# Patient Record
Sex: Male | Born: 1960 | Race: Black or African American | Hispanic: No | Marital: Single | Smoking: Former smoker
Health system: Southern US, Community
[De-identification: ages and names within clinical notes are randomized; demographics above are authoritative.]

## PROBLEM LIST (undated history)

## (undated) DIAGNOSIS — G8929 Other chronic pain: Secondary | ICD-10-CM

## (undated) DIAGNOSIS — M549 Dorsalgia, unspecified: Secondary | ICD-10-CM

## (undated) HISTORY — PX: LEG SURGERY: SHX1003

## (undated) HISTORY — PX: APPENDECTOMY: SHX54

## (undated) HISTORY — PX: BACK SURGERY: SHX140

---

## 2016-07-16 ENCOUNTER — Encounter (HOSPITAL_COMMUNITY): Payer: Self-pay

## 2016-07-16 ENCOUNTER — Emergency Department (HOSPITAL_COMMUNITY): Payer: Self-pay

## 2016-07-16 ENCOUNTER — Emergency Department (HOSPITAL_COMMUNITY)
Admission: EM | Admit: 2016-07-16 | Discharge: 2016-07-17 | Disposition: A | Discharge status: Home / Self Care | Payer: Self-pay | Attending: Emergency Medicine | Admitting: Emergency Medicine

## 2016-07-16 DIAGNOSIS — Y939 Activity, unspecified: Secondary | ICD-10-CM | POA: Insufficient documentation

## 2016-07-16 DIAGNOSIS — Y999 Unspecified external cause status: Secondary | ICD-10-CM | POA: Insufficient documentation

## 2016-07-16 DIAGNOSIS — T148XXA Other injury of unspecified body region, initial encounter: Secondary | ICD-10-CM

## 2016-07-16 DIAGNOSIS — S40022A Contusion of left upper arm, initial encounter: Secondary | ICD-10-CM | POA: Insufficient documentation

## 2016-07-16 DIAGNOSIS — Z87891 Personal history of nicotine dependence: Secondary | ICD-10-CM | POA: Insufficient documentation

## 2016-07-16 DIAGNOSIS — Y92410 Unspecified street and highway as the place of occurrence of the external cause: Secondary | ICD-10-CM | POA: Insufficient documentation

## 2016-07-16 HISTORY — DX: Dorsalgia, unspecified: M54.9

## 2016-07-16 HISTORY — DX: Other chronic pain: G89.29
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
## 2016-07-17 ENCOUNTER — Emergency Department (HOSPITAL_COMMUNITY): Payer: Self-pay

## 2016-07-17 MED ORDER — DICLOFENAC SODIUM 50 MG PO TBEC: 50.0000 mg | DELAYED_RELEASE_TABLET | Freq: Two times a day (BID) | ORAL | 0 refills | Status: DC

## 2016-07-17 MED ORDER — CYCLOBENZAPRINE HCL 10 MG PO TABS: 10.0000 mg | ORAL_TABLET | Freq: Two times a day (BID) | ORAL | 0 refills | Status: DC | PRN

## 2016-07-17 NOTE — Discharge Instructions (Signed)
Do not drive while taking the muscle relaxant as it will make you sleepy. Return for any problems.  °
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
## 2017-05-21 ENCOUNTER — Observation Stay (HOSPITAL_COMMUNITY)
Admission: EM | Admit: 2017-05-21 | Discharge: 2017-05-22 | Disposition: A | Discharge status: Home / Self Care | Payer: Self-pay | Attending: Orthopedic Surgery | Admitting: Orthopedic Surgery

## 2017-05-21 ENCOUNTER — Emergency Department (HOSPITAL_COMMUNITY): Payer: Self-pay

## 2017-05-21 ENCOUNTER — Encounter (HOSPITAL_COMMUNITY): Payer: Self-pay

## 2017-05-21 ENCOUNTER — Encounter (HOSPITAL_COMMUNITY)
Admission: EM | Disposition: A | Discharge status: Home / Self Care | Payer: Self-pay | Source: Home / Self Care | Attending: Emergency Medicine

## 2017-05-21 ENCOUNTER — Emergency Department (HOSPITAL_COMMUNITY): Payer: Self-pay | Admitting: Certified Registered Nurse Anesthetist

## 2017-05-21 DIAGNOSIS — Z87891 Personal history of nicotine dependence: Secondary | ICD-10-CM | POA: Insufficient documentation

## 2017-05-21 DIAGNOSIS — S8291XA Unspecified fracture of right lower leg, initial encounter for closed fracture: Secondary | ICD-10-CM

## 2017-05-21 DIAGNOSIS — Z419 Encounter for procedure for purposes other than remedying health state, unspecified: Secondary | ICD-10-CM

## 2017-05-21 DIAGNOSIS — W000XXA Fall on same level due to ice and snow, initial encounter: Secondary | ICD-10-CM | POA: Insufficient documentation

## 2017-05-21 DIAGNOSIS — S82291A Other fracture of shaft of right tibia, initial encounter for closed fracture: Principal | ICD-10-CM | POA: Insufficient documentation

## 2017-05-21 DIAGNOSIS — S82201A Unspecified fracture of shaft of right tibia, initial encounter for closed fracture: Secondary | ICD-10-CM | POA: Diagnosis present

## 2017-05-21 HISTORY — PX: TIBIA IM NAIL INSERTION: SHX2516

## 2017-05-21 LAB — CBC WITH DIFFERENTIAL/PLATELET
BASOS ABS: 0 10*3/uL (ref 0.0–0.1)
BASOS PCT: 0 %
EOS ABS: 0.1 10*3/uL (ref 0.0–0.7)
EOS PCT: 1 %
HCT: 44.8 % (ref 39.0–52.0)
Hemoglobin: 15.1 g/dL (ref 13.0–17.0)
Lymphocytes Relative: 31 %
Lymphs Abs: 3.3 10*3/uL (ref 0.7–4.0)
MCH: 28.8 pg (ref 26.0–34.0)
MCHC: 33.7 g/dL (ref 30.0–36.0)
MCV: 85.3 fL (ref 78.0–100.0)
MONO ABS: 0.7 10*3/uL (ref 0.1–1.0)
Monocytes Relative: 6 %
Neutro Abs: 6.6 10*3/uL (ref 1.7–7.7)
Neutrophils Relative %: 62 %
Platelets: 300 10*3/uL (ref 150–400)
RBC: 5.25 MIL/uL (ref 4.22–5.81)
RDW: 12.8 % (ref 11.5–15.5)
WBC: 10.7 10*3/uL — ABNORMAL HIGH (ref 4.0–10.5)

## 2017-05-21 LAB — I-STAT CHEM 8, ED
BUN: 10 mg/dL (ref 6–20)
CALCIUM ION: 1.28 mmol/L (ref 1.15–1.40)
CHLORIDE: 104 mmol/L (ref 101–111)
Creatinine, Ser: 1.1 mg/dL (ref 0.61–1.24)
GLUCOSE: 117 mg/dL — AB (ref 65–99)
HCT: 47 % (ref 39.0–52.0)
Hemoglobin: 16 g/dL (ref 13.0–17.0)
Potassium: 4.4 mmol/L (ref 3.5–5.1)
SODIUM: 139 mmol/L (ref 135–145)
TCO2: 25 mmol/L (ref 22–32)

## 2017-05-21 SURGERY — INSERTION, INTRAMEDULLARY ROD, TIBIA
Anesthesia: General | Site: Ankle | Laterality: Right

## 2017-05-21 MED ORDER — FENTANYL CITRATE (PF) 250 MCG/5ML IJ SOLN
INTRAMUSCULAR | Status: DC | PRN
  Administered 2017-05-21: 150 ug via INTRAVENOUS
  Administered 2017-05-21: 100 ug via INTRAVENOUS

## 2017-05-21 MED ORDER — LACTATED RINGERS IV SOLN
INTRAVENOUS | Status: DC
  Administered 2017-05-21 – 2017-05-22 (×2): via INTRAVENOUS

## 2017-05-21 MED ORDER — MIDAZOLAM HCL 2 MG/2ML IJ SOLN
INTRAMUSCULAR | Status: DC | PRN
  Administered 2017-05-21: 2 mg via INTRAVENOUS

## 2017-05-21 MED ORDER — ROCURONIUM BROMIDE 10 MG/ML (PF) SYRINGE
PREFILLED_SYRINGE | INTRAVENOUS | Status: AC
  Filled 2017-05-21: qty 10

## 2017-05-21 MED ORDER — KETOROLAC TROMETHAMINE 15 MG/ML IJ SOLN
15.0000 mg | Freq: Four times a day (QID) | INTRAMUSCULAR | Status: AC
  Administered 2017-05-21 – 2017-05-22 (×4): 15 mg via INTRAVENOUS
  Filled 2017-05-21 (×3): qty 1

## 2017-05-21 MED ORDER — OXYCODONE HCL 5 MG PO TABS: 5.0000 mg | ORAL_TABLET | ORAL | Status: DC | PRN

## 2017-05-21 MED ORDER — DEXAMETHASONE SODIUM PHOSPHATE 10 MG/ML IJ SOLN
INTRAMUSCULAR | Status: DC | PRN
  Administered 2017-05-21: 10 mg via INTRAVENOUS

## 2017-05-21 MED ORDER — PROMETHAZINE HCL 25 MG/ML IJ SOLN
INTRAMUSCULAR | Status: AC
  Administered 2017-05-21: 6.25 mg via INTRAVENOUS
  Filled 2017-05-21: qty 1

## 2017-05-21 MED ORDER — SORBITOL 70 % SOLN: 30.0000 mL | Freq: Every day | Status: DC | PRN

## 2017-05-21 MED ORDER — ACETAMINOPHEN 650 MG RE SUPP: 650.0000 mg | RECTAL | Status: DC | PRN

## 2017-05-21 MED ORDER — DOCUSATE SODIUM 100 MG PO CAPS: 100.0000 mg | ORAL_CAPSULE | Freq: Two times a day (BID) | ORAL | 0 refills | Status: AC

## 2017-05-21 MED ORDER — MEPERIDINE HCL 25 MG/ML IJ SOLN: 6.2500 mg | INTRAMUSCULAR | Status: DC | PRN

## 2017-05-21 MED ORDER — ENOXAPARIN SODIUM 40 MG/0.4ML ~~LOC~~ SOLN
40.0000 mg | SUBCUTANEOUS | Status: DC
  Filled 2017-05-21: qty 0.4

## 2017-05-21 MED ORDER — METHOCARBAMOL 500 MG PO TABS: 500.0000 mg | ORAL_TABLET | Freq: Four times a day (QID) | ORAL | 0 refills | Status: AC | PRN

## 2017-05-21 MED ORDER — CEFAZOLIN SODIUM-DEXTROSE 2-4 GM/100ML-% IV SOLN
2.0000 g | INTRAVENOUS | Status: AC
  Administered 2017-05-21: 3 g via INTRAVENOUS
  Filled 2017-05-21: qty 100

## 2017-05-21 MED ORDER — HYDROMORPHONE HCL 1 MG/ML IJ SOLN
INTRAMUSCULAR | Status: AC
  Administered 2017-05-21: 0.5 mg via INTRAVENOUS
  Filled 2017-05-21: qty 1

## 2017-05-21 MED ORDER — HYDROMORPHONE HCL 1 MG/ML IJ SOLN
0.5000 mg | Freq: Once | INTRAMUSCULAR | Status: AC
Start: 2017-05-21 — End: 2017-05-21
  Administered 2017-05-21: 0.5 mg via INTRAVENOUS
  Filled 2017-05-21: qty 1

## 2017-05-21 MED ORDER — ACETAMINOPHEN 500 MG PO TABS
1000.0000 mg | ORAL_TABLET | Freq: Three times a day (TID) | ORAL | Status: AC
  Administered 2017-05-21 – 2017-05-22 (×3): 1000 mg via ORAL
  Filled 2017-05-21 (×3): qty 2

## 2017-05-21 MED ORDER — FENTANYL CITRATE (PF) 250 MCG/5ML IJ SOLN
INTRAMUSCULAR | Status: AC
  Filled 2017-05-21: qty 5

## 2017-05-21 MED ORDER — PHENYLEPHRINE 40 MCG/ML (10ML) SYRINGE FOR IV PUSH (FOR BLOOD PRESSURE SUPPORT)
PREFILLED_SYRINGE | INTRAVENOUS | Status: AC
  Filled 2017-05-21: qty 30

## 2017-05-21 MED ORDER — CEFAZOLIN SODIUM-DEXTROSE 2-4 GM/100ML-% IV SOLN
2.0000 g | Freq: Four times a day (QID) | INTRAVENOUS | Status: AC
  Administered 2017-05-21 – 2017-05-22 (×3): 2 g via INTRAVENOUS
  Filled 2017-05-21 (×4): qty 100

## 2017-05-21 MED ORDER — OXYCODONE HCL 5 MG PO TABS
10.0000 mg | ORAL_TABLET | ORAL | Status: DC | PRN
Start: 2017-05-21 — End: 2017-05-23
  Administered 2017-05-21 – 2017-05-22 (×5): 10 mg via ORAL
  Filled 2017-05-21 (×4): qty 2

## 2017-05-21 MED ORDER — ROCURONIUM BROMIDE 10 MG/ML (PF) SYRINGE
PREFILLED_SYRINGE | INTRAVENOUS | Status: DC | PRN
  Administered 2017-05-21 (×2): 20 mg via INTRAVENOUS
  Administered 2017-05-21: 60 mg via INTRAVENOUS

## 2017-05-21 MED ORDER — ONDANSETRON HCL 4 MG PO TABS: 4.0000 mg | ORAL_TABLET | Freq: Four times a day (QID) | ORAL | Status: DC | PRN

## 2017-05-21 MED ORDER — SUGAMMADEX SODIUM 200 MG/2ML IV SOLN
INTRAVENOUS | Status: DC | PRN
  Administered 2017-05-21: 250 mg via INTRAVENOUS

## 2017-05-21 MED ORDER — SENNA 8.6 MG PO TABS
1.0000 | ORAL_TABLET | Freq: Two times a day (BID) | ORAL | Status: DC
  Administered 2017-05-21 – 2017-05-22 (×2): 8.6 mg via ORAL
  Filled 2017-05-21 (×2): qty 1

## 2017-05-21 MED ORDER — POVIDONE-IODINE 10 % EX SWAB: 2.0000 "application " | Freq: Once | CUTANEOUS | Status: DC

## 2017-05-21 MED ORDER — LIDOCAINE 2% (20 MG/ML) 5 ML SYRINGE
INTRAMUSCULAR | Status: DC | PRN
  Administered 2017-05-21: 100 mg via INTRAVENOUS

## 2017-05-21 MED ORDER — ONDANSETRON HCL 4 MG/2ML IJ SOLN
4.0000 mg | Freq: Four times a day (QID) | INTRAMUSCULAR | Status: DC | PRN
  Administered 2017-05-22: 4 mg via INTRAVENOUS
  Filled 2017-05-21: qty 2

## 2017-05-21 MED ORDER — HYDROMORPHONE HCL 1 MG/ML IJ SOLN
0.2500 mg | INTRAMUSCULAR | Status: DC | PRN
  Administered 2017-05-21 (×4): 0.5 mg via INTRAVENOUS

## 2017-05-21 MED ORDER — ACETAMINOPHEN 325 MG PO TABS: 650.0000 mg | ORAL_TABLET | ORAL | Status: DC | PRN

## 2017-05-21 MED ORDER — DIPHENHYDRAMINE HCL 12.5 MG/5ML PO ELIX: 12.5000 mg | ORAL_SOLUTION | ORAL | Status: DC | PRN

## 2017-05-21 MED ORDER — DEXAMETHASONE SODIUM PHOSPHATE 10 MG/ML IJ SOLN
INTRAMUSCULAR | Status: AC
  Filled 2017-05-21: qty 2

## 2017-05-21 MED ORDER — ROCURONIUM BROMIDE 10 MG/ML (PF) SYRINGE
PREFILLED_SYRINGE | INTRAVENOUS | Status: AC
  Filled 2017-05-21: qty 5

## 2017-05-21 MED ORDER — CHLORHEXIDINE GLUCONATE 4 % EX LIQD
60.0000 mL | Freq: Once | CUTANEOUS | Status: DC
  Filled 2017-05-21: qty 60

## 2017-05-21 MED ORDER — METHOCARBAMOL 500 MG PO TABS
500.0000 mg | ORAL_TABLET | Freq: Four times a day (QID) | ORAL | Status: DC | PRN
  Administered 2017-05-21 – 2017-05-22 (×3): 500 mg via ORAL
  Filled 2017-05-21 (×2): qty 1

## 2017-05-21 MED ORDER — METOCLOPRAMIDE HCL 5 MG/ML IJ SOLN: 5.0000 mg | Freq: Three times a day (TID) | INTRAMUSCULAR | Status: DC | PRN

## 2017-05-21 MED ORDER — ONDANSETRON HCL 4 MG/2ML IJ SOLN
INTRAMUSCULAR | Status: AC
  Filled 2017-05-21: qty 6

## 2017-05-21 MED ORDER — OXYCODONE HCL 5 MG PO TABS: 5.0000 mg | ORAL_TABLET | ORAL | 0 refills | Status: AC | PRN

## 2017-05-21 MED ORDER — PROMETHAZINE HCL 25 MG/ML IJ SOLN
6.2500 mg | INTRAMUSCULAR | Status: DC | PRN
  Administered 2017-05-21: 6.25 mg via INTRAVENOUS

## 2017-05-21 MED ORDER — KETOROLAC TROMETHAMINE 30 MG/ML IJ SOLN: 30.0000 mg | Freq: Once | INTRAMUSCULAR | Status: DC | PRN

## 2017-05-21 MED ORDER — PROPOFOL 10 MG/ML IV BOLUS
INTRAVENOUS | Status: AC
  Filled 2017-05-21: qty 20

## 2017-05-21 MED ORDER — HYDROMORPHONE HCL 1 MG/ML IJ SOLN: 1.0000 mg | INTRAMUSCULAR | Status: DC | PRN

## 2017-05-21 MED ORDER — PROPOFOL 10 MG/ML IV BOLUS
INTRAVENOUS | Status: DC | PRN
  Administered 2017-05-21: 200 mg via INTRAVENOUS

## 2017-05-21 MED ORDER — ASPIRIN EC 81 MG PO TBEC: 81.0000 mg | DELAYED_RELEASE_TABLET | Freq: Every day | ORAL | 0 refills | Status: AC

## 2017-05-21 MED ORDER — LABETALOL HCL 5 MG/ML IV SOLN
20.0000 mg | INTRAVENOUS | Status: DC | PRN
  Filled 2017-05-21: qty 4

## 2017-05-21 MED ORDER — DOCUSATE SODIUM 100 MG PO CAPS
100.0000 mg | ORAL_CAPSULE | Freq: Two times a day (BID) | ORAL | Status: DC
  Administered 2017-05-21 – 2017-05-22 (×2): 100 mg via ORAL
  Filled 2017-05-21 (×2): qty 1

## 2017-05-21 MED ORDER — ONDANSETRON HCL 4 MG PO TABS: 4.0000 mg | ORAL_TABLET | Freq: Three times a day (TID) | ORAL | 0 refills | Status: AC | PRN

## 2017-05-21 MED ORDER — HYDROMORPHONE HCL 1 MG/ML IJ SOLN
1.0000 mg | Freq: Once | INTRAMUSCULAR | Status: AC
  Administered 2017-05-21: 1 mg via INTRAVENOUS
  Filled 2017-05-21: qty 1

## 2017-05-21 MED ORDER — EPHEDRINE 5 MG/ML INJ
INTRAVENOUS | Status: AC
  Filled 2017-05-21: qty 10

## 2017-05-21 MED ORDER — ONDANSETRON HCL 4 MG/2ML IJ SOLN
INTRAMUSCULAR | Status: DC | PRN
  Administered 2017-05-21: 4 mg via INTRAVENOUS

## 2017-05-21 MED ORDER — MIDAZOLAM HCL 2 MG/2ML IJ SOLN
INTRAMUSCULAR | Status: AC
  Filled 2017-05-21: qty 2

## 2017-05-21 MED ORDER — METHOCARBAMOL 1000 MG/10ML IJ SOLN: 500.0000 mg | Freq: Four times a day (QID) | INTRAVENOUS | Status: DC | PRN

## 2017-05-21 MED ORDER — POLYETHYLENE GLYCOL 3350 17 G PO PACK: 17.0000 g | PACK | Freq: Every day | ORAL | Status: DC | PRN

## 2017-05-21 MED ORDER — ACETAMINOPHEN 500 MG PO TABS
1000.0000 mg | ORAL_TABLET | Freq: Three times a day (TID) | ORAL | 0 refills | Status: AC
Start: 2017-05-21 — End: 2017-06-04

## 2017-05-21 MED ORDER — SUGAMMADEX SODIUM 200 MG/2ML IV SOLN
INTRAVENOUS | Status: AC
  Filled 2017-05-21: qty 2

## 2017-05-21 MED ORDER — TRAMADOL HCL 50 MG PO TABS: 100.0000 mg | ORAL_TABLET | Freq: Two times a day (BID) | ORAL | Status: DC | PRN

## 2017-05-21 MED ORDER — 0.9 % SODIUM CHLORIDE (POUR BTL) OPTIME
TOPICAL | Status: DC | PRN
  Administered 2017-05-21: 1000 mL

## 2017-05-21 MED ORDER — SUGAMMADEX SODIUM 200 MG/2ML IV SOLN
INTRAVENOUS | Status: AC
  Filled 2017-05-21: qty 4

## 2017-05-21 MED ORDER — LACTATED RINGERS IV SOLN
INTRAVENOUS | Status: DC | PRN
  Administered 2017-05-21: 16:00:00 via INTRAVENOUS

## 2017-05-21 MED ORDER — METOCLOPRAMIDE HCL 5 MG PO TABS: 5.0000 mg | ORAL_TABLET | Freq: Three times a day (TID) | ORAL | Status: DC | PRN

## 2017-05-21 MED ORDER — LIDOCAINE 2% (20 MG/ML) 5 ML SYRINGE
INTRAMUSCULAR | Status: AC
  Filled 2017-05-21: qty 10

## 2017-05-21 SURGICAL SUPPLY — 88 items
BANDAGE ACE 4X5 VEL STRL LF (GAUZE/BANDAGES/DRESSINGS) ×4 IMPLANT
BANDAGE ACE 6X5 VEL STRL LF (GAUZE/BANDAGES/DRESSINGS) ×4 IMPLANT
BANDAGE ESMARK 6X9 LF (GAUZE/BANDAGES/DRESSINGS) ×2 IMPLANT
BIT DRILL AO GAMMA 4.2X180 (BIT) ×4 IMPLANT
BIT DRILL AO GAMMA 4.2X340 (BIT) ×4 IMPLANT
BIT DRILL CANN 2.7 (BIT) ×1
BIT DRILL CANN 2.7MM (BIT) ×1
BIT DRILL SRG 2.7XCANN AO CPLG (BIT) ×2 IMPLANT
BIT DRL SRG 2.7XCANN AO CPLNG (BIT) ×2
BLADE SURG 10 STRL SS (BLADE) ×4 IMPLANT
BNDG COHESIVE 4X5 TAN STRL (GAUZE/BANDAGES/DRESSINGS) ×4 IMPLANT
BNDG COHESIVE 6X5 TAN STRL LF (GAUZE/BANDAGES/DRESSINGS) IMPLANT
BNDG ELASTIC 6X15 VLCR STRL LF (GAUZE/BANDAGES/DRESSINGS) ×4 IMPLANT
BNDG ESMARK 6X9 LF (GAUZE/BANDAGES/DRESSINGS) ×4
BNDG GAUZE ELAST 4 BULKY (GAUZE/BANDAGES/DRESSINGS) ×8 IMPLANT
BRUSH SCRUB SURG 4.25 DISP (MISCELLANEOUS) ×8 IMPLANT
CHLORAPREP W/TINT 26ML (MISCELLANEOUS) ×4 IMPLANT
COVER MAYO STAND STRL (DRAPES) ×4 IMPLANT
COVER SURGICAL LIGHT HANDLE (MISCELLANEOUS) ×8 IMPLANT
CUFF TOURNIQUET SINGLE 34IN LL (TOURNIQUET CUFF) IMPLANT
CUFF TOURNIQUET SINGLE 44IN (TOURNIQUET CUFF) IMPLANT
DRAPE C-ARM 42X72 X-RAY (DRAPES) ×4 IMPLANT
DRAPE C-ARMOR (DRAPES) ×4 IMPLANT
DRAPE HALF SHEET 40X57 (DRAPES) ×4 IMPLANT
DRAPE IMP U-DRAPE 54X76 (DRAPES) ×4 IMPLANT
DRAPE U-SHAPE 47X51 STRL (DRAPES) ×4 IMPLANT
DRESSING OPSITE X SMALL 2X3 (GAUZE/BANDAGES/DRESSINGS) ×4 IMPLANT
DRSG ADAPTIC 3X8 NADH LF (GAUZE/BANDAGES/DRESSINGS) ×8 IMPLANT
DRSG PAD ABDOMINAL 8X10 ST (GAUZE/BANDAGES/DRESSINGS) ×12 IMPLANT
DRSG TEGADERM 4X4.75 (GAUZE/BANDAGES/DRESSINGS) ×8 IMPLANT
ELECT CAUTERY BLADE 6.4 (BLADE) ×4 IMPLANT
ELECT REM PT RETURN 9FT ADLT (ELECTROSURGICAL) ×4
ELECTRODE REM PT RTRN 9FT ADLT (ELECTROSURGICAL) ×2 IMPLANT
END CAP TIBIAL T2 11.5X10 (Cap) ×4 IMPLANT
GAUZE SPONGE 4X4 12PLY STRL (GAUZE/BANDAGES/DRESSINGS) ×4 IMPLANT
GAUZE XEROFORM 5X9 LF (GAUZE/BANDAGES/DRESSINGS) ×4 IMPLANT
GLOVE BIO SURGEON STRL SZ7.5 (GLOVE) ×8 IMPLANT
GLOVE BIO SURGEON STRL SZ8 (GLOVE) ×4 IMPLANT
GLOVE BIOGEL PI IND STRL 8 (GLOVE) ×4 IMPLANT
GLOVE BIOGEL PI INDICATOR 8 (GLOVE) ×4
GOWN STRL REUS W/ TWL LRG LVL3 (GOWN DISPOSABLE) ×6 IMPLANT
GOWN STRL REUS W/ TWL XL LVL3 (GOWN DISPOSABLE) ×2 IMPLANT
GOWN STRL REUS W/TWL LRG LVL3 (GOWN DISPOSABLE) ×6
GOWN STRL REUS W/TWL XL LVL3 (GOWN DISPOSABLE) ×2
GUIDEROD T2 3X1000 (ROD) ×4 IMPLANT
GUIDEWIRE GAMMA (WIRE) ×8 IMPLANT
K-WIRE FIXATION 3X285 COATED (WIRE) ×8
K-WIRE ORTHOPEDIC 1.4X150L (WIRE) ×4
KIT BASIN OR (CUSTOM PROCEDURE TRAY) ×4 IMPLANT
KIT ROOM TURNOVER OR (KITS) ×4 IMPLANT
KWIRE FIXATION 3X285 COATED (WIRE) ×4 IMPLANT
KWIRE ORTHOPEDIC 1.4X150L (WIRE) ×2 IMPLANT
MANIFOLD NEPTUNE II (INSTRUMENTS) ×4 IMPLANT
NAIL ELAS INSERT SLV SPI 8-11 (MISCELLANEOUS) ×4 IMPLANT
NAIL TIBIAL STANDARD 9X390MM (Nail) ×4 IMPLANT
NAIL TIBIAL STD 9X390MM (Nail) ×2 IMPLANT
NS IRRIG 1000ML POUR BTL (IV SOLUTION) ×4 IMPLANT
PACK ORTHO EXTREMITY (CUSTOM PROCEDURE TRAY) ×4 IMPLANT
PACK UNIVERSAL I (CUSTOM PROCEDURE TRAY) ×4 IMPLANT
PAD ABD 8X10 STRL (GAUZE/BANDAGES/DRESSINGS) ×4 IMPLANT
PAD ARMBOARD 7.5X6 YLW CONV (MISCELLANEOUS) ×8 IMPLANT
PAD CAST 4YDX4 CTTN HI CHSV (CAST SUPPLIES) ×2 IMPLANT
PADDING CAST COTTON 4X4 STRL (CAST SUPPLIES) ×2
PADDING CAST COTTON 6X4 STRL (CAST SUPPLIES) ×16 IMPLANT
REAMER INTRAMEDULLARY 8MM 510 (MISCELLANEOUS) ×4 IMPLANT
SCREW CORTEX 3.5MM 22MM (Screw) ×4 IMPLANT
SCREW LOCK FULL THREAD 05X57.5 (Screw) ×4 IMPLANT
SCREW LOCKING T2 F/T  5MMX40MM (Screw) ×2 IMPLANT
SCREW LOCKING T2 F/T  5X37.5MM (Screw) ×2 IMPLANT
SCREW LOCKING T2 F/T 5MMX40MM (Screw) ×2 IMPLANT
SCREW LOCKING T2 F/T 5X37.5MM (Screw) ×2 IMPLANT
SCREW LOCKING THREADED 5X47.5 (Screw) ×4 IMPLANT
SPONGE LAP 18X18 X RAY DECT (DISPOSABLE) ×4 IMPLANT
STAPLER VISISTAT 35W (STAPLE) IMPLANT
STOCKINETTE IMPERVIOUS LG (DRAPES) IMPLANT
SUT ETHILON 3 0 PS 1 (SUTURE) ×8 IMPLANT
SUT MON AB 2-0 CT1 27 (SUTURE) ×4 IMPLANT
SUT VIC AB 0 CT1 27 (SUTURE) ×4
SUT VIC AB 0 CT1 27XBRD ANBCTR (SUTURE) ×4 IMPLANT
SYR BULB IRRIGATION 50ML (SYRINGE) ×4 IMPLANT
TOWEL OR 17X24 6PK STRL BLUE (TOWEL DISPOSABLE) ×4 IMPLANT
TOWEL OR 17X26 10 PK STRL BLUE (TOWEL DISPOSABLE) ×8 IMPLANT
TOWEL OR NON WOVEN STRL DISP B (DISPOSABLE) ×4 IMPLANT
TUBE CONNECTING 12'X1/4 (SUCTIONS) ×1
TUBE CONNECTING 12X1/4 (SUCTIONS) ×3 IMPLANT
UNDERPAD 30X30 (UNDERPADS AND DIAPERS) ×4 IMPLANT
WATER STERILE IRR 1000ML POUR (IV SOLUTION) ×4 IMPLANT
YANKAUER SUCT BULB TIP NO VENT (SUCTIONS) ×4 IMPLANT

## 2017-05-21 NOTE — ED Notes (Signed)
Patient transported to X-ray 

## 2017-05-21 NOTE — ED Provider Notes (Signed)
[facility] Provider Note   CSN: [id number] Arrival date & time: 05/21/17  1208     History   Chief Complaint Chief Complaint  Patient presents with  . Fall  . Leg Injury    HPI Lee Gregory is a 56 y.o. male.  HPI Patient presents after a right lower leg injury.  States he was at [employer] and then stepped on a curb and fell.  Severe pain in his right lower leg along with deformity.  States he also had some pain in his right hip.  States he thinks the right hip was just from positioning.  Denies head neck or back pain.  Not on anticoagulation.  Did have breakfast this morning but has not had lunch.  No numbness or weakness.  Former smoker that quit around a year ago.  No known drug allergies. Past Medical History:  Diagnosis Date  . Chronic back pain     Patient Active Problem List   Diagnosis Date Noted  . Right tibial fracture 05/21/2017    Past Surgical History:  Procedure Laterality Date  . APPENDECTOMY    . BACK SURGERY    . LEG SURGERY         Home Medications    Prior to Admission medications   Medication Sig Start Date End Date Taking? Authorizing Provider  cyclobenzaprine (FLEXERIL) 10 MG tablet Take 1 tablet (10 mg total) by mouth 2 (two) times daily as needed for muscle spasms. Patient not taking: Reported on 05/21/2017 07/17/16   Janne Napoleon, NP  diclofenac (VOLTAREN) 50 MG EC tablet Take 1 tablet (50 mg total) by mouth 2 (two) times daily. Patient not taking: Reported on 05/21/2017 07/17/16   Janne Napoleon, NP    Family History History reviewed. No pertinent family history.  Social History Social History   Tobacco Use  . Smoking status: Former Games developer  . Smokeless tobacco: Never Used  Substance Use Topics  . Alcohol use: No    Frequency: Never  . Drug use: No     Allergies   Patient has no known allergies.   Review of Systems Review of Systems  Constitutional: Negative for appetite change.  HENT: Negative  for congestion.   Respiratory: Negative for shortness of breath.   Cardiovascular: Negative for chest pain.  Gastrointestinal: Negative for abdominal pain.  Genitourinary: Negative for flank pain.  Musculoskeletal: Negative for back pain and neck pain.       Right lower leg and right hip pain.  Neurological: Negative for weakness and numbness.     Physical Exam Updated Vital Signs BP (!) 144/79   Pulse 87   Temp 98 F (36.7 C) (Oral)   Resp 17   Ht 6\' 3"  (1.905 m)   Wt 115.7 kg (255 lb)   SpO2 96%   BMI 31.87 kg/m   Physical Exam  Constitutional: He appears well-developed.  HENT:  Head: Atraumatic.  Eyes: Pupils are equal, round, and reactive to light.  Neck: Neck supple.  Cardiovascular: Normal rate.  Pulmonary/Chest: Effort normal.  Abdominal: Soft. There is no tenderness.  Musculoskeletal: He exhibits tenderness.  Tenderness and deformity in the distal right lower leg.  Skin intact.  Neurovascular intact right foot.  No tenderness over ankle.  No tenderness over knee.  Right hip is held somewhat flexed and externally rotated.  Mild pelvic tenderness.  Somewhat difficult to examine due to patient positioning in the hallway.  Neurological: He is alert.  Neurovascular intact  in right foot.  Sensation grossly intact in right foot.  Skin: Skin is warm. Capillary refill takes less than 2 seconds.  Psychiatric: He has a normal mood and affect.     ED Treatments / Results  Labs (all labs ordered are listed, but only abnormal results are displayed) Labs Reviewed  CBC WITH DIFFERENTIAL/PLATELET - Abnormal; Notable for the following components:      Result Value   WBC 10.7 (*)    All other components within normal limits  I-STAT CHEM 8, ED - Abnormal; Notable for the following components:   Glucose, Bld 117 (*)    All other components within normal limits    EKG  EKG Interpretation None       Radiology Dg Pelvis 1-2 Views  Result Date: 05/21/2017 CLINICAL  DATA:  Pain following fall EXAM: PELVIS - 1-2 VIEW COMPARISON:  None. FINDINGS: There is no evidence of pelvic fracture or dislocation. Joint spaces appear normal. There are small spurs along each lateral acetabulum. No erosive change. IMPRESSION: No fracture or dislocation. No appreciable joint space narrowing or erosion. Electronically Signed   By: Bretta BangWilliam  Woodruff III M.D.   On: 05/21/2017 13:00   Dg Tibia/fibula Right  Result Date: 05/21/2017 CLINICAL DATA:  Pain following fall EXAM: RIGHT TIBIA AND FIBULA - 2 VIEW COMPARISON:  July 16, 2016 FINDINGS: Frontal and lateral views were obtained. There is a comminuted fracture of the tibia in the diaphyseal region approximately 8.5 cm proximal to the tibial plafond. This fracture is comminuted with lateral displacement and medial angulation of the distal major fracture fragment with respect proximal major fragment. In addition, there is a comminuted obliquely oriented fracture of the distal fibular diaphysis with mild lateral displacement and anterior angulation distally. No other fractures are evident. No dislocations are appreciable. There is a spur arising from the inferior calcaneus. IMPRESSION: Comminuted distal tibial and fibular fractures with displacement and angulation of fracture fragments. No other fractures. No dislocations. Inferior calcaneal spur noted. Electronically Signed   By: Bretta BangWilliam  Woodruff III M.D.   On: 05/21/2017 12:59   Ct Ankle Right Wo Contrast  Result Date: 05/21/2017 CLINICAL DATA:  Distal tibial and fibular fractures. EXAM: CT OF THE RIGHT ANKLE WITHOUT CONTRAST TECHNIQUE: Multidetector CT imaging of the right ankle was performed according to the standard protocol. Multiplanar CT image reconstructions were also generated. COMPARISON:  None. FINDINGS: Bones/Joint/Cartilage 1. Acute, closed, comminuted distal diaphyseal fracture of the fibula with 1 shaft width medial and posterior displacement of the distal fracture  fragment. Roughly 18 degrees of valgus angulation is noted of the distal fracture fragment and reformatted images. 2. Acute, closed, comminuted distal diaphyseal fracture of the tibia with fracture approximately 7 cm from the tibial plafond. 23 degrees of valgus angulation is noted of the distal fracture fragment on reformatted images with 1 shaft width lateral displacement and 1/4 shaft width dorsal displacement of the main distal fracture fragment. 3. Intact tibiotalar, subtalar and midfoot articulations. Calcaneal enthesopathy is noted along the plantar dorsal aspect. Ligaments Suboptimally assessed by CT. Muscles and Tendons No intramuscular hemorrhage.  No muscle atrophy. Soft tissues Posttraumatic soft tissue edema adjacent to the fractures. No drainable fluid collection or active hemorrhage. IMPRESSION: 1. Acute, closed, comminuted distal diaphyseal fracture of the fibula with 1 shaft width medial and posterior displacement of the distal fracture fragment. Roughly 18 degrees of valgus angulation is noted of the distal fracture fragment. 2. Acute, closed, comminuted distal diaphyseal fracture of the tibia with fracture  approximately 7 cm from the tibial plafond. 23 degrees of valgus angulation is noted of the distal fracture fragment with 1 shaft width lateral displacement and 1/4 shaft width dorsal displacement of the main distal fracture fragment. 3. Intact tibiotalar, subtalar and midfoot articulations. Calcaneal enthesopathy is noted along the plantar dorsal aspect. Electronically Signed   By: Tollie Ethavid  Kwon M.D.   On: 05/21/2017 16:27    Procedures Procedures (including critical care time)  Medications Ordered in ED Medications  chlorhexidine (HIBICLENS) 4 % liquid 4 application (not administered)  povidone-iodine 10 % swab 2 application (not administered)  0.9 % irrigation (POUR BTL) (1,000 mLs Irrigation Given 05/21/17 1704)  HYDROmorphone (DILAUDID) injection 0.5 mg (0.5 mg Intravenous Given  05/21/17 1231)  HYDROmorphone (DILAUDID) injection 1 mg (1 mg Intravenous Given 05/21/17 1321)  ceFAZolin (ANCEF) IVPB 2g/100 mL premix (3 g Intravenous Given 05/21/17 1635)     Initial Impression / Assessment and Plan / ED Course  I have reviewed the triage vital signs and the nursing notes.  Pertinent labs & imaging results that were available during my care of the patient were reviewed by me and considered in my medical decision making (see chart for details).     Patient with fall.  Distal tibia and fibular fracture.  Admit to orthopnea repair.  Skin is closed.  Final Clinical Impressions(s) / ED Diagnoses   Final diagnoses:  Closed fracture of right lower leg, initial encounter    ED Discharge Orders    None       Benjiman CorePickering, Whitaker Holderman, MD 05/21/17 1705

## 2017-05-21 NOTE — Anesthesia Procedure Notes (Signed)
Procedure Name: Intubation Date/Time: 05/21/2017 4:31 PM Performed by: Julieta Bellini, CRNA Pre-anesthesia Checklist: Patient identified, Emergency Drugs available, Suction available and Patient being monitored Patient Re-evaluated:Patient Re-evaluated prior to induction Oxygen Delivery Method: Circle system utilized Preoxygenation: Pre-oxygenation with 100% oxygen Induction Type: IV induction Ventilation: Mask ventilation with difficulty, Oral airway inserted - appropriate to patient size and Two handed mask ventilation required Laryngoscope Size: Mac and 4 Grade View: Grade I Tube type: Oral Tube size: 7.5 mm Number of attempts: 1 Airway Equipment and Method: Stylet Placement Confirmation: ETT inserted through vocal cords under direct vision,  positive ETCO2 and breath sounds checked- equal and bilateral Secured at: 25 cm Tube secured with: Tape Dental Injury: Teeth and Oropharynx as per pre-operative assessment

## 2017-05-21 NOTE — H&P (Signed)
ORTHOPAEDIC CONSULTATION  REQUESTING PHYSICIAN: No att. providers found  Chief Complaint: right tibia fracture  HPI: Lee Gregory is a 56 y.o. male who complains of a fall on the ice with R leg pain. Denies numbness or tingling  Past Medical History:  Diagnosis Date  . Chronic back pain    Past Surgical History:  Procedure Laterality Date  . APPENDECTOMY    . BACK SURGERY    . LEG SURGERY     Social History   Socioeconomic History  . Marital status: Single    Spouse name: None  . Number of children: None  . Years of education: None  . Highest education level: None  Social Needs  . Financial resource strain: None  . Food insecurity - worry: None  . Food insecurity - inability: None  . Transportation needs - medical: None  . Transportation needs - non-medical: None  Occupational History  . None  Tobacco Use  . Smoking status: Former Research scientist (life sciences)  . Smokeless tobacco: Never Used  Substance and Sexual Activity  . Alcohol use: No    Frequency: Never  . Drug use: No  . Sexual activity: None  Other Topics Concern  . None  Social History Narrative  . None   History reviewed. No pertinent family history. No Known Allergies Prior to Admission medications   Medication Sig Start Date End Date Taking? Authorizing Provider  cyclobenzaprine (FLEXERIL) 10 MG tablet Take 1 tablet (10 mg total) by mouth 2 (two) times daily as needed for muscle spasms. Patient not taking: Reported on 05/21/2017 07/17/16   Ashley Murrain, NP  diclofenac (VOLTAREN) 50 MG EC tablet Take 1 tablet (50 mg total) by mouth 2 (two) times daily. Patient not taking: Reported on 05/21/2017 07/17/16   Ashley Murrain, NP   Dg Pelvis 1-2 Views  Result Date: 05/21/2017 CLINICAL DATA:  Pain following fall EXAM: PELVIS - 1-2 VIEW COMPARISON:  None. FINDINGS: There is no evidence of pelvic fracture or dislocation. Joint spaces appear normal. There are small spurs along each lateral acetabulum. No erosive change.  IMPRESSION: No fracture or dislocation. No appreciable joint space narrowing or erosion. Electronically Signed   By: Lowella Grip III M.D.   On: 05/21/2017 13:00   Dg Tibia/fibula Right  Result Date: 05/21/2017 CLINICAL DATA:  Pain following fall EXAM: RIGHT TIBIA AND FIBULA - 2 VIEW COMPARISON:  July 16, 2016 FINDINGS: Frontal and lateral views were obtained. There is a comminuted fracture of the tibia in the diaphyseal region approximately 8.5 cm proximal to the tibial plafond. This fracture is comminuted with lateral displacement and medial angulation of the distal major fracture fragment with respect proximal major fragment. In addition, there is a comminuted obliquely oriented fracture of the distal fibular diaphysis with mild lateral displacement and anterior angulation distally. No other fractures are evident. No dislocations are appreciable. There is a spur arising from the inferior calcaneus. IMPRESSION: Comminuted distal tibial and fibular fractures with displacement and angulation of fracture fragments. No other fractures. No dislocations. Inferior calcaneal spur noted. Electronically Signed   By: Lowella Grip III M.D.   On: 05/21/2017 12:59    Positive ROS: All other systems have been reviewed and were otherwise negative with the exception of those mentioned in the HPI and as above.  Labs cbc Recent Labs    05/21/17 1225 05/21/17 1243  WBC 10.7*  --   HGB 15.1 16.0  HCT 44.8 47.0  PLT 300  --  Labs inflam No results for input(s): CRP in the last 72 hours.  Invalid input(s): ESR  Labs coag No results for input(s): INR, PTT in the last 72 hours.  Invalid input(s): PT  Recent Labs    05/21/17 1243  NA 139  K 4.4  CL 104  GLUCOSE 117*  BUN 10  CREATININE 1.10    Physical Exam: Vitals:   05/21/17 1500 05/21/17 1515  BP: 129/80 (!) 144/79  Pulse: 88 87  Resp: 14 17  Temp:    SpO2: 93% 96%   General: Alert, no acute distress Cardiovascular: No  pedal edema Respiratory: No cyanosis, no use of accessory musculature GI: No organomegaly, abdomen is soft and non-tender Skin: No lesions in the area of chief complaint other than those listed below in MSK exam.  Neurologic: Sensation intact distally save for the below mentioned MSK exam Psychiatric: Patient is competent for consent with normal mood and affect Lymphatic: No axillary or cervical lymphadenopathy  MUSCULOSKELETAL:  RLE: NVI, compartments soft. Other extremities are atraumatic with painless ROM and NVI.  Assessment: R tibia fracture with Pilon extension  Plan: ORIF and IM nail of pilon and tibia today   Renette Butters, MD Cell [phone]   05/21/2017 4:18 PM

## 2017-05-21 NOTE — Progress Notes (Signed)
Orthopedic Tech Progress Note Patient Details:  Levon HedgerDaniel Pender 02-16-1961 [id number]  Ortho Devices Type of Ortho Device: CAM walker Ortho Device/Splint Location: RLE Ortho Device/Splint Interventions: Ordered, Application   Post Interventions Patient Tolerated: Well Instructions Provided: Care of device   Jennye MoccasinHughes, Neng Albee Craig 05/21/2017, 6:22 PM

## 2017-05-21 NOTE — Transfer of Care (Signed)
Immediate Anesthesia Transfer of Care Note  Patient: Lee HedgerDaniel Vansickle  Procedure(s) Performed: INTRAMEDULLARY (IM) NAIL TIBIAL (Right Ankle)  Patient Location: PACU  Anesthesia Type:General  Level of Consciousness: alert , oriented, drowsy and patient cooperative  Airway & Oxygen Therapy: Patient Spontanous Breathing and Patient connected to nasal cannula oxygen  Post-op Assessment: Report given to RN, Post -op Vital signs reviewed and stable and Patient moving all extremities X 4  Post vital signs: Reviewed and stable  Last Vitals:  Vitals:   05/21/17 1500 05/21/17 1515  BP: 129/80 (!) 144/79  Pulse: 88 87  Resp: 14 17  Temp:    SpO2: 93% 96%    Last Pain:  Vitals:   05/21/17 1529  TempSrc:   PainSc: 2          Complications: No apparent anesthesia complications

## 2017-05-21 NOTE — Consult Note (Signed)
Reason for Consult:Right tib/fib fx Referring Physician: Belinda Fisher Pickering  Lee Gregory is an 56 y.o. male.  HPI: Lee Gregory was going into [employer] when he slipped on some ice and fell. He's unsure exactly how he landed but had immediate pain in his right lower leg. He could not ambulate afterwards. He came to the ED for evaluation and x-rays showed a comminuted right distal tib/fib fx with likely extension into the joint. Orthopedic surgery was consulted.  Past Medical History:  Diagnosis Date  . Chronic back pain     Past Surgical History:  Procedure Laterality Date  . APPENDECTOMY    . BACK SURGERY    . LEG SURGERY      No family history on file.  Social History:  reports that he has quit smoking. he has never used smokeless tobacco. He reports that he does not drink alcohol or use drugs.  Allergies: No Known Allergies  Medications: I have reviewed the patient's current medications.  Results for orders placed or performed during the hospital encounter of 05/21/17 (from the past 48 hour(s))  CBC with Differential     Status: Abnormal   Collection Time: 05/21/17 12:25 PM  Result Value Ref Range   WBC 10.7 (H) 4.0 - 10.5 K/uL   RBC 5.25 4.22 - 5.81 MIL/uL   Hemoglobin 15.1 13.0 - 17.0 g/dL   HCT 96.044.8 45.439.0 - 09.852.0 %   MCV 85.3 78.0 - 100.0 fL   MCH 28.8 26.0 - 34.0 pg   MCHC 33.7 30.0 - 36.0 g/dL   RDW 11.912.8 14.711.5 - 82.915.5 %   Platelets 300 150 - 400 K/uL   Neutrophils Relative % 62 %   Neutro Abs 6.6 1.7 - 7.7 K/uL   Lymphocytes Relative 31 %   Lymphs Abs 3.3 0.7 - 4.0 K/uL   Monocytes Relative 6 %   Monocytes Absolute 0.7 0.1 - 1.0 K/uL   Eosinophils Relative 1 %   Eosinophils Absolute 0.1 0.0 - 0.7 K/uL   Basophils Relative 0 %   Basophils Absolute 0.0 0.0 - 0.1 K/uL  I-stat Chem 8, ED     Status: Abnormal   Collection Time: 05/21/17 12:43 PM  Result Value Ref Range   Sodium 139 135 - 145 mmol/L   Potassium 4.4 3.5 - 5.1 mmol/L   Chloride 104 101 - 111 mmol/L   BUN  10 6 - 20 mg/dL   Creatinine, Ser 5.621.10 0.61 - 1.24 mg/dL   Glucose, Bld 130117 (H) 65 - 99 mg/dL   Calcium, Ion 8.651.28 7.841.15 - 1.40 mmol/L   TCO2 25 22 - 32 mmol/L   Hemoglobin 16.0 13.0 - 17.0 g/dL   HCT 69.647.0 29.539.0 - 28.452.0 %    Dg Pelvis 1-2 Views  Result Date: 05/21/2017 CLINICAL DATA:  Pain following fall EXAM: PELVIS - 1-2 VIEW COMPARISON:  None. FINDINGS: There is no evidence of pelvic fracture or dislocation. Joint spaces appear normal. There are small spurs along each lateral acetabulum. No erosive change. IMPRESSION: No fracture or dislocation. No appreciable joint space narrowing or erosion. Electronically Signed   By: Bretta BangWilliam  Woodruff III M.D.   On: 05/21/2017 13:00   Dg Tibia/fibula Right  Result Date: 05/21/2017 CLINICAL DATA:  Pain following fall EXAM: RIGHT TIBIA AND FIBULA - 2 VIEW COMPARISON:  July 16, 2016 FINDINGS: Frontal and lateral views were obtained. There is a comminuted fracture of the tibia in the diaphyseal region approximately 8.5 cm proximal to the tibial plafond. This fracture is  comminuted with lateral displacement and medial angulation of the distal major fracture fragment with respect proximal major fragment. In addition, there is a comminuted obliquely oriented fracture of the distal fibular diaphysis with mild lateral displacement and anterior angulation distally. No other fractures are evident. No dislocations are appreciable. There is a spur arising from the inferior calcaneus. IMPRESSION: Comminuted distal tibial and fibular fractures with displacement and angulation of fracture fragments. No other fractures. No dislocations. Inferior calcaneal spur noted. Electronically Signed   By: Bretta BangWilliam  Woodruff III M.D.   On: 05/21/2017 12:59    Review of Systems  Constitutional: Negative for weight loss.  HENT: Negative for ear discharge, ear pain, hearing loss and tinnitus.   Eyes: Negative for blurred vision, double vision, photophobia and pain.  Respiratory:  Negative for cough, sputum production and shortness of breath.   Cardiovascular: Negative for chest pain.  Gastrointestinal: Negative for abdominal pain, nausea and vomiting.  Genitourinary: Negative for dysuria, flank pain, frequency and urgency.  Musculoskeletal: Positive for back pain and joint pain (Right lower leg). Negative for falls, myalgias and neck pain.  Neurological: Negative for dizziness, tingling, sensory change, focal weakness, loss of consciousness and headaches.  Endo/Heme/Allergies: Does not bruise/bleed easily.  Psychiatric/Behavioral: Negative for depression, memory loss and substance abuse. The patient is not nervous/anxious.    Blood pressure 115/60, pulse 84, temperature 98 F (36.7 C), temperature source Oral, resp. rate 17, height 6\' 3"  (1.905 m), weight 115.7 kg (255 lb), SpO2 94 %. Physical Exam  Constitutional: He appears well-developed and well-nourished. No distress.  HENT:  Head: Normocephalic.  Eyes: Conjunctivae are normal. Right eye exhibits no discharge. Left eye exhibits no discharge. No scleral icterus.  Neck: Normal range of motion.  Cardiovascular: Normal rate and regular rhythm.  Respiratory: Effort normal. No respiratory distress.  Musculoskeletal:  RLE No traumatic wounds, ecchymosis, or rash  TTP ankle and proximal  No knee or ankle effusion  Knee stable to varus/ valgus and anterior/posterior stress  Sens DPN, SPN, TN intact  Motor EHL, ext, flex, evers 5/5  DP 2+, PT 2+, No significant edema  Neurological: He is alert.  Skin: Skin is warm and dry. He is not diaphoretic.  Psychiatric: He has a normal mood and affect. His behavior is normal.    Assessment/Plan: Fall Right tib/fib fxs -- CT of affected area then OR for ex fix vs IMN by Dr. Eulah PontMurphy. Please keep NPO until then. NWB.    Freeman CaldronMichael J. Phoebie Shad, PA-C Orthopedic Surgery [phone] 05/21/2017, 2:54 PM

## 2017-05-21 NOTE — Discharge Instructions (Signed)
Elevate leg at all times to reduce pain / swelling.  Diet: As you were doing prior to hospitalization   Dressing:  Keep dressings on and dry until follow up.  Activity:  Increase activity slowly as tolerated, but follow the weight bearing instructions below.  The rules on driving is that you can not be taking narcotics while you drive, and you must feel in control of the vehicle.    Weight Bearing:  You may bear weight as tolerated in walking boot.   To prevent constipation: you may use a stool softener such as -  Colace (over the counter) 100 mg by mouth twice a day  Drink plenty of fluids (prune juice may be helpful) and high fiber foods Miralax (over the counter) for constipation as needed.    Itching:  If you experience itching with your medications, try taking only a single pain pill, or even half a pain pill at a time.  You can also use benadryl over the counter for itching or also to help with sleep.   Precautions:  If you experience chest pain or shortness of breath - call 911 immediately for transfer to the hospital emergency department!!  If you develop a fever greater that 101 F, purulent drainage from wound, increased redness or drainage from wound, or calf pain -- Call the office at [phone]                                                 Follow- Up Appointment:  Please call for an appointment to be seen in 1-2 weeks [place] - [phone]

## 2017-05-21 NOTE — ED Triage Notes (Signed)
Pt arrives EMS with c/o fall on ice and injury to right ankle. No LOC or other injury. Obvious deformity to  Right ankle. Soft splint per EMS

## 2017-05-21 NOTE — Anesthesia Preprocedure Evaluation (Signed)
Anesthesia Evaluation  Patient identified by MRN, date of birth, ID band Patient awake    Reviewed: Allergy & Precautions, NPO status   Airway Mallampati: I       Dental no notable dental hx. (+) Teeth Intact   Pulmonary former smoker,    breath sounds clear to auscultation       Cardiovascular negative cardio ROS   Rhythm:Regular Rate:Normal     Neuro/Psych negative neurological ROS  negative psych ROS   GI/Hepatic negative GI ROS, Neg liver ROS,   Endo/Other    Renal/GU negative Renal ROS  negative genitourinary   Musculoskeletal   Abdominal (+) + obese,   Peds  Hematology negative hematology ROS (+)   Anesthesia Other Findings   Reproductive/Obstetrics                             Anesthesia Physical Anesthesia Plan  ASA: II  Anesthesia Plan: General   Post-op Pain Management:    Induction: Intravenous  PONV Risk Score and Plan: 3 and Dexamethasone, Ondansetron and Midazolam  Airway Management Planned: Oral ETT  Additional Equipment:   Intra-op Plan:   Post-operative Plan: Extubation in OR  Informed Consent: I have reviewed the patients History and Physical, chart, labs and discussed the procedure including the risks, benefits and alternatives for the proposed anesthesia with the patient or authorized representative who has indicated his/her understanding and acceptance.   Dental advisory given  Plan Discussed with: CRNA and Surgeon  Anesthesia Plan Comments:         Anesthesia Quick Evaluation

## 2017-05-21 NOTE — Op Note (Signed)
05/21/2017  5:48 PM  PATIENT:  Lee Gregory    PRE-OPERATIVE DIAGNOSIS:  right tibial fracture  POST-OPERATIVE DIAGNOSIS:  Same  PROCEDURE:  EXTERNAL FIXATION LEG, INTRAMEDULLARY (IM) NAIL TIBIAL  SURGEON:  Rosamund Nyland, Jewel BaizeIMOTHY D, MD  ASSISTANT: Aquilla HackerHenry Martensen, PA-C, he was present and scrubbed throughout the case, critical for completion in a timely fashion, and for retraction, instrumentation, and closure.   ANESTHESIA:   General  PREOPERATIVE INDICATIONS:  Lee HedgerDaniel Bender is a  56 y.o. male with a diagnosis of right tibial fracture who failed conservative measures and elected for surgical management.    The risks benefits and alternatives were discussed with the patient preoperatively including but not limited to the risks of infection, bleeding, nerve injury, cardiopulmonary complications, the need for revision surgery, among others, and the patient was willing to proceed.  OPERATIVE IMPLANTS: [employer]ryker T2 Tibial nail    BLOOD LOSS: minimal  COMPLICATIONS: none  TOURNIQUET TIME: none  OPERATIVE PROCEDURE:  Patient was identified in the preoperative holding area and site was marked by me He was transported to the operating theater and placed on the table in supine position taking care to pad all bony prominences. After a preincinduction time out anesthesia was induced. The right lower extremity was prepped and draped in normal sterile fashion and a pre-incision timeout was performed. Lee Hedgeraniel Atlas received ancef for preoperative antibiotics.   I placed a K-wire across the fracture in the pilon and was happy with the alignment of the fracture and the hardware. I placed a cannulated screw here.   The leg was placed over the bone foam. I then made a 4 cm incision over the quad tendon. I dissected down and incised the quad tendon. I placed the suprapatellar sleeve and secured it into place taking care to not damage the patella femoral joint.   I placed a guidewire under fluoroscopic  guidance just medial to lateral tibial spine. I was happy with this placement on all views. I used the entry reamer to create a path into the proximal tibia staying out of the joint itself.  I then passed the ball tip guidewire down the tibia and across the fracture site. I held appropriate reduction confirmed on multiple views of fluoroscopy and sequentially reamed up to an appropriate size with appropriate chatter. I selected the above-sized nail and passed it down the ball-tipped guidewire and seated it completely to a was sunk into the proximal tibia.  I then used the outrigger placed proximal locking screws. I was happy with her length and placement on multiple oblique fluoroscopic views.  Next I confirmed appropriate rotation of the tibia with fracture tease and orientation of the patella and toes. I then used a perfect circles technique to place a distal static interlock screw.  The wounds were then all thoroughly irrigated and closed in layers. Sterile dressing was applied and he was taken to the PACU in stable condition.  POST OPERATIVE PLAN: WBAT in boot, DVT prophylaxis: Early ambulation, SCD's, chemical px

## 2017-05-22 ENCOUNTER — Encounter (HOSPITAL_COMMUNITY): Payer: Self-pay | Admitting: Orthopedic Surgery

## 2017-05-22 NOTE — Evaluation (Addendum)
Occupational Therapy Evaluation Patient Details Name: Lee HedgerDaniel Gregory MRN: [id number] DOB: 14-Jul-1960 Today's Date: 05/22/2017    History of Present Illness 56 y.o. male s/p R Tibial ORIF and IM nail after fall on ice 12/11. PMH includes: R leg surgery, back surgery.    Clinical Impression   This 56 y/o M presents with the above. At baseline Pt is independent with ADLs and functional mobility. Pt lives alone, reports he will be staying with his girlfriend initially after discharge who will be available intermittently to assist with ADLs. Pt completed room level functional mobility at RW level with MinGuard assist, tub transfer to 3:1 with MinA, currently requires minA for LB ADLs and min verbal safety cues throughout session. Education provided and questions answered throughout; Due to Pt not having 24hr assist available, recommend additional HHOT services after discharge to maximize Pt's safety and independence with ADLs and mobility after return home.      Follow Up Recommendations  DC plan and follow up therapy as arranged by surgeon;Home health OT    Equipment Recommendations  3 in 1 bedside commode           Precautions / Restrictions Precautions Precautions: Fall Restrictions Weight Bearing Restrictions: Yes RLE Weight Bearing: Weight bearing as tolerated      Mobility Bed Mobility   Bed Mobility: Supine to Sit;Sit to Supine     Supine to sit: Min guard;HOB elevated Sit to supine: Min guard   General bed mobility comments: minguard for safety, Pt able to hook RLE using LLE for added support when advancing LEs onto bed  Transfers Overall transfer level: Needs assistance Equipment used: Rolling walker (2 wheeled) Transfers: Sit to/from Stand   Stand pivot transfers: Min guard;Supervision       General transfer comment: min guard for transfers, cues for safety with walker. pt able to power up with physical assistance.     Balance Overall balance assessment:  History of Falls                                         ADL either performed or assessed with clinical judgement   ADL Overall ADL's : Needs assistance/impaired Eating/Feeding: Set up;Sitting   Grooming: Min guard;Standing   Upper Body Bathing: Min guard;Sitting   Lower Body Bathing: Minimal assistance;Sit to/from stand   Upper Body Dressing : Set up;Sitting   Lower Body Dressing: Minimal assistance;Sit to/from stand Lower Body Dressing Details (indicate cue type and reason): educated on safety and compensatory techniques for completing LB ADLs Toilet Transfer: Min guard;Ambulation;Regular Toilet;RW   Toileting- ArchitectClothing Manipulation and Hygiene: Min guard;Sit to/from stand   Tub/ Shower Transfer: Tub transfer;Minimal assistance;Ambulation;3 in 1;Rolling walker;Cueing for safety Tub/Shower Transfer Details (indicate cue type and reason): Pt demonstrating transfer with MinA for balance/safety; educated on having girlfriend present to provide supervision/assist initially during transfer with Pt verbalizing understanding Functional mobility during ADLs: Min guard;Rolling walker General ADL Comments: educated on compensatory techniques during ADL completion; min verbal cues for safety during session as Pt attempting to stand without RW placed in front of him; educated on safe RW use during ADLs/functional mobility                          Pertinent Vitals/Pain Pain Assessment: Faces Faces Pain Scale: Hurts little more Pain Location: R Leg Pain Descriptors / Indicators: Discomfort;Grimacing;Operative site guarding  Pain Intervention(s): Limited activity within patient's tolerance;Monitored during session     Hand Dominance     Extremity/Trunk Assessment Upper Extremity Assessment Upper Extremity Assessment: Overall WFL for tasks assessed   Lower Extremity Assessment Lower Extremity Assessment: Defer to PT evaluation       Communication  Communication Communication: No difficulties;HOH(deaf in R ear )   Cognition Arousal/Alertness: Awake/alert Behavior During Therapy: WFL for tasks assessed/performed Overall Cognitive Status: Within Functional Limits for tasks assessed                                                      Home Living Family/patient expects to be discharged to:: Private residence Living Arrangements: Spouse/significant other(girlfriend) Available Help at Discharge: Friend(s);Available PRN/intermittently(girlfriend) Type of Home: House Home Access: Stairs to enter Entergy CorporationEntrance Stairs-Number of Steps: 3 Entrance Stairs-Rails: Can reach both Home Layout: Able to live on main level with bedroom/bathroom     Bathroom Shower/Tub: Tub only   FirefighterBathroom Toilet: Standard Bathroom Accessibility: Yes   Home Equipment: None   Additional Comments: Pt will be staying at girlfriend's home initially; girlfriend works most of the day       Prior Functioning/Environment Level of Independence: Independent                 OT Problem List: Decreased range of motion;Decreased activity tolerance;Decreased knowledge of use of DME or AE;Pain;Impaired balance (sitting and/or standing)      OT Treatment/Interventions: Self-care/ADL training;DME and/or AE instruction;Therapeutic activities;Balance training;Therapeutic exercise;Manual therapy;Neuromuscular education;Energy conservation;Patient/family education    OT Goals(Current goals can be found in the care plan section) Acute Rehab OT Goals Patient Stated Goal: to retun to girlfriends house  OT Goal Formulation: With patient Time For Goal Achievement: 06/05/17 Potential to Achieve Goals: Good  OT Frequency: Min 2X/week                             AM-PAC PT "6 Clicks" Daily Activity     Outcome Measure Help from another person eating meals?: None Help from another person taking care of personal grooming?: None Help from another  person toileting, which includes using toliet, bedpan, or urinal?: A Little Help from another person bathing (including washing, rinsing, drying)?: A Little Help from another person to put on and taking off regular upper body clothing?: None Help from another person to put on and taking off regular lower body clothing?: A Little 6 Click Score: 21   End of Session Equipment Utilized During Treatment: Gait belt;Rolling walker;Other (comment)(R cam boot ) Nurse Communication: Mobility status  Activity Tolerance: Patient tolerated treatment well Patient left: in bed;with call bell/phone within reach;with bed alarm set  OT Visit Diagnosis: Unsteadiness on feet (R26.81);Other abnormalities of gait and mobility (R26.89);History of falling (Z91.81)                Time: 1027-25361113-1146 OT Time Calculation (min): 33 min Charges:  OT General Charges $OT Visit: 1 Visit OT Evaluation $OT Eval Low Complexity: 1 Low OT Treatments $Self Care/Home Management : 8-22 mins G-Codes: OT G-codes **NOT FOR INPATIENT CLASS** Functional Assessment Tool Used: AM-PAC 6 Clicks Daily Activity;Clinical judgement Functional Limitation: Self care Self Care Current Status (U4403(G8987): At least 20 percent but less than 40 percent impaired, limited or restricted Self Care Goal Status (  W0981): At least 1 percent but less than 20 percent impaired, limited or restricted   Marcy Siren, OT Pager [phone] 05/22/2017   Orlando Penner 05/22/2017, 12:56 PM

## 2017-05-22 NOTE — Care Management (Signed)
Case manager requested DME for patient. Unfortunately patient's injury does not qualify for charity Home Health services.

## 2017-05-22 NOTE — Plan of Care (Signed)
  Progressing Pain Managment: General experience of comfort will improve 05/22/2017 0454 - Progressing by Agnes LawrenceLackey, Hamad Whyte, RN Safety: Ability to remain free from injury will improve 05/22/2017 0454 - Progressing by Agnes LawrenceLackey, Stony Stegmann, RN 05/22/2017 0454 - Progressing by Agnes LawrenceLackey, Tsuyako Jolley, RN Skin Integrity: Risk for impaired skin integrity will decrease 05/22/2017 0454 - Progressing by Agnes LawrenceLackey, Layna Roeper, RN

## 2017-05-22 NOTE — Plan of Care (Signed)
  Progressing Education: Knowledge of General Education information will improve 05/22/2017 1154 - Progressing by Darreld Mcleanox, Bernal Luhman, RN Health Behavior/Discharge Planning: Ability to manage health-related needs will improve 05/22/2017 1154 - Progressing by Darreld Mcleanox, Carinne Brandenburger, RN Clinical Measurements: Ability to maintain clinical measurements within normal limits will improve 05/22/2017 1154 - Progressing by Darreld Mcleanox, Victoriana Aziz, RN Will remain free from infection 05/22/2017 1154 - Progressing by Darreld Mcleanox, Collyn Ribas, RN Diagnostic test results will improve 05/22/2017 1154 - Progressing by Darreld Mcleanox, Randalyn Ahmed, RN Respiratory complications will improve 05/22/2017 1154 - Progressing by Darreld Mcleanox, Saraya Tirey, RN Cardiovascular complication will be avoided 05/22/2017 1154 - Progressing by Darreld Mcleanox, Polo Mcmartin, RN Activity: Risk for activity intolerance will decrease 05/22/2017 1154 - Progressing by Darreld Mcleanox, Dorraine Ellender, RN Nutrition: Adequate nutrition will be maintained 05/22/2017 1154 - Progressing by Darreld Mcleanox, Eligha Kmetz, RN Coping: Level of anxiety will decrease 05/22/2017 1154 - Progressing by Darreld Mcleanox, Cheyeanne Roadcap, RN Elimination: Will not experience complications related to bowel motility 05/22/2017 1154 - Progressing by Darreld Mcleanox, Thomos Domine, RN Will not experience complications related to urinary retention 05/22/2017 1154 - Progressing by Darreld Mcleanox, Aleem Elza, RN Pain Managment: General experience of comfort will improve 05/22/2017 1154 - Progressing by Darreld Mcleanox, Nica Friske, RN Safety: Ability to remain free from injury will improve 05/22/2017 1154 - Progressing by Darreld Mcleanox, Darcell Yacoub, RN Skin Integrity: Risk for impaired skin integrity will decrease 05/22/2017 1154 - Progressing by Darreld Mcleanox, Benedetta Sundstrom, RN

## 2017-05-22 NOTE — Evaluation (Addendum)
Physical Therapy Evaluation Patient Details Name: Lee Gregory MRN: [id number] DOB: Jul 16, 1960 Today's Date: 05/22/2017   History of Present Illness  56 y.o. male s/p R Tibial ORIF and IM nail after fall on ice 12/11. PMH includes: R leg surgery, back surgery.   Clinical Impression  Patient is s/p above surgery resulting in functional limitations due to the deficits listed below (see PT Problem List). PTA, pt was independent with all mobility. Pt intends to d/c to girlfriends house who is avail PRN due to work schedule, with 3 stairs to enter. Upon eval, patient presents with post op pain and weakness limiting his mobility. Currently min guard level with transfers and ambulation. Session focused heavily on preparation for safe return home with stair, transfer, and gait training. Discussed safety considerations for safe return return home. Pt reports no more question or concerns and is agreeable to work with home health therapist to maximize safety and balance upon d/c.     Follow Up Recommendations Home health PT;Supervision for mobility/OOB;DC plan and follow up therapy as arranged by surgeon    Equipment Recommendations  Rolling walker with 5" wheels;3in1 (PT)    Recommendations for Other Services       Precautions / Restrictions Precautions Precautions: Fall Restrictions Weight Bearing Restrictions: Yes RLE Weight Bearing: Weight bearing as tolerated      Mobility  Bed Mobility Overal bed mobility: Needs Assistance Bed Mobility: Supine to Sit     Supine to sit: Min assist     General bed mobility comments: min A for RLE assist. cues for techinque using LLE, pt able to perform without physical assistance after   Transfers Overall transfer level: Needs assistance Equipment used: Rolling walker (2 wheeled) Transfers: Sit to/from [facility] Sit to Stand: Min guard;Supervision Stand pivot transfers: Min guard;Supervision       General transfer comment:  min guard to supervision for transfers, cues for safety with walker. pt able to power up with physical assitance.   Ambulation/Gait Ambulation/Gait assistance: Min guard Ambulation Distance (Feet): 50 Feet Assistive device: Rolling walker (2 wheeled) Gait Pattern/deviations: Step-to pattern;Antalgic Gait velocity: decreased   General Gait Details: cues for sequencing with RW, patient ambulating with safe dynamic balance and stability in RW   Stairs Stairs: Yes Stairs assistance: Min guard Stair Management: Two rails Number of Stairs: 12 General stair comments: min guard, cues for seqeucning. Patient progressed confidence throughout session. Discussed importance to have gf clear steps of ice and guard him for safe home entry today  Wheelchair Mobility    Modified Rankin (Stroke Patients Only)       Balance Overall balance assessment: History of Falls                                           Pertinent Vitals/Pain Pain Assessment: 0-10 Pain Score: 8  Pain Location: R Leg Pain Descriptors / Indicators: Discomfort;Grimacing;Operative site guarding Pain Intervention(s): Limited activity within patient's tolerance;Monitored during session;Premedicated before session    Home Living Family/patient expects to be discharged to:: Private residence Living Arrangements: Spouse/significant other Available Help at Discharge: Friend(s);Available PRN/intermittently(girlfriend ) Type of Home: House Home Access: Stairs to enter Entrance Stairs-Rails: Can reach both Entrance Stairs-Number of Steps: 3 Home Layout: Able to live on main level with bedroom/bathroom Home Equipment: None      Prior Function Level of Independence: Independent  Hand Dominance        Extremity/Trunk Assessment   Upper Extremity Assessment Upper Extremity Assessment: Overall WFL for tasks assessed    Lower Extremity Assessment Lower Extremity Assessment:  (Strength: RLE: 3-/5 post op, LLE: 4/5 globally)       Communication   Communication: No difficulties;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     Exercises     Assessment/Plan    PT Assessment All further PT needs can be met in the next venue of care  PT Problem List Decreased strength;Decreased range of motion;Decreased activity tolerance;Decreased balance;Decreased knowledge of use of DME;Pain;Decreased mobility       PT Treatment Interventions      PT Goals (Current goals can be found in the Care Plan section)  Acute Rehab PT Goals Patient Stated Goal: to retun to girlfriends house  PT Goal Formulation: With patient Time For Goal Achievement: 05/29/17 Potential to Achieve Goals: Good    Frequency     Barriers to discharge        Co-evaluation               AM-PAC PT "6 Clicks" Daily Activity  Outcome Measure Difficulty turning over in bed (including adjusting bedclothes, sheets and blankets)?: Unable Difficulty moving from lying on back to sitting on the side of the bed? : Unable Difficulty sitting down on and standing up from a chair with arms (e.g., wheelchair, bedside commode, etc,.)?: Unable Help needed moving to and from a bed to chair (including a wheelchair)?: A Little Help needed walking in hospital room?: A Little Help needed climbing 3-5 steps with a railing? : A Little 6 Click Score: 12    End of Session Equipment Utilized During Treatment: Gait belt Activity Tolerance: Patient tolerated treatment well Patient left: in chair;with call bell/phone within reach Nurse Communication: Mobility status PT Visit Diagnosis: Unsteadiness on feet  (R26.81);Other abnormalities of gait and mobility (R26.89);Repeated falls (R29.6);Muscle weakness (generalized) (M62.81);Pain Pain - Right/Left: Right Pain - part of body: Leg    Time: 0822-0915 PT Time Calculation (min) (ACUTE ONLY): 53 min   Charges:   PT Evaluation $PT Eval Low Complexity: 1 Low PT Treatments $Gait Training: 23-37 mins $Therapeutic Activity: 8-22 mins   PT G Codes:   PT G-Codes **NOT FOR INPATIENT CLASS** Functional Assessment Tool Used: AM-PAC 6 Clicks Basic Mobility;Clinical judgement Functional Limitation: Mobility: Walking and moving around Mobility: Walking and Moving Around Current Status (A1587): At least 40 percent but less than 60 percent impaired, limited or restricted Mobility: Walking and Moving Around Goal Status [phone]): At least 40 percent but less than 60 percent impaired, limited or restricted Mobility: Walking and Moving Around Discharge Status [phone]): At least 40 percent but less than 60 percent impaired, limited or restricted    Reinaldo Berber, PT, DPT Acute Rehab Services Pager: [place] 05/22/2017, 9:49 AM

## 2017-05-22 NOTE — Discharge Summary (Signed)
Discharge Summary  Patient ID: Lee HedgerDaniel Gregory MRN: [id number] DOB/AGE: December 31, 1960 56 y.o.  Admit date: 05/21/2017 Discharge date: 05/22/2017  Admission Diagnoses:  Right tibial fracture  Discharge Diagnoses:  Principal Problem:   Right tibial fracture Active Problems:   Closed right tibial fracture   Past Medical History:  Diagnosis Date  . Chronic back pain     Surgeries: Procedure(s): INTRAMEDULLARY (IM) NAIL TIBIAL on 05/21/2017   Consultants (if any):   Discharged Condition: Improved  Hospital Course: Lee HedgerDaniel Gregory is an 56 y.o. male who was admitted 05/21/2017 with a diagnosis of Right tibial fracture and went to the operating room on 05/21/2017 and underwent the above named procedures.    He was given perioperative antibiotics:  Anti-infectives (From admission, onward)   Start     Dose/Rate Route Frequency Ordered Stop   05/21/17 2230  ceFAZolin (ANCEF) IVPB 2g/100 mL premix     2 g 200 mL/hr over 30 Minutes Intravenous Every 6 hours 05/21/17 2009 05/22/17 1629   05/21/17 1700  ceFAZolin (ANCEF) IVPB 2g/100 mL premix     2 g 200 mL/hr over 30 Minutes Intravenous To Short Stay 05/21/17 1530 05/21/17 1650    .  He was given sequential compression devices, early ambulation, and Lovenox for DVT prophylaxis.  He benefited maximally from the hospital stay and there were no complications.    Recent vital signs:  Vitals:   05/21/17 2015 05/22/17 0204  BP: (!) 151/68 124/63  Pulse: 98 (!) 103  Resp:  15  Temp: 98.2 F (36.8 C) 98.4 F (36.9 C)  SpO2: 95% 100%    Recent laboratory studies:  Lab Results  Component Value Date   HGB 16.0 05/21/2017   HGB 15.1 05/21/2017   Lab Results  Component Value Date   WBC 10.7 (H) 05/21/2017   PLT 300 05/21/2017   No results found for: INR Lab Results  Component Value Date   NA 139 05/21/2017   K 4.4 05/21/2017   CL 104 05/21/2017   BUN 10 05/21/2017   CREATININE 1.10 05/21/2017   GLUCOSE 117 (H) 05/21/2017     Discharge Medications:   Allergies as of 05/22/2017   No Known Allergies     Medication List    STOP taking these medications   cyclobenzaprine 10 MG tablet Commonly known as:  FLEXERIL   diclofenac 50 MG EC tablet Commonly known as:  VOLTAREN     TAKE these medications   acetaminophen 500 MG tablet Commonly known as:  TYLENOL Take 2 tablets (1,000 mg total) by mouth every 8 (eight) hours for 14 days.   aspirin EC 81 MG tablet Take 1 tablet (81 mg total) by mouth daily.   docusate sodium 100 MG capsule Commonly known as:  COLACE Take 1 capsule (100 mg total) by mouth 2 (two) times daily. To prevent constipation while taking pain medication.   methocarbamol 500 MG tablet Commonly known as:  ROBAXIN Take 1 tablet (500 mg total) by mouth every 6 (six) hours as needed for muscle spasms.   ondansetron 4 MG tablet Commonly known as:  ZOFRAN Take 1 tablet (4 mg total) by mouth every 8 (eight) hours as needed for nausea or vomiting.   oxyCODONE 5 MG immediate release tablet Commonly known as:  ROXICODONE Take 1-2 tablets (5-10 mg total) by mouth every 4 (four) hours as needed for breakthrough pain.            Durable Medical Equipment  (From admission, onward)  Start     Ordered   05/21/17 2010  DME Walker rolling  Once    Question:  Patient needs a walker to treat with the following condition  Answer:  Closed right tibial fracture   05/21/17 2009      Diagnostic Studies: Dg Pelvis 1-2 Views  Result Date: 05/21/2017 CLINICAL DATA:  Pain following fall EXAM: PELVIS - 1-2 VIEW COMPARISON:  None. FINDINGS: There is no evidence of pelvic fracture or dislocation. Joint spaces appear normal. There are small spurs along each lateral acetabulum. No erosive change. IMPRESSION: No fracture or dislocation. No appreciable joint space narrowing or erosion. Electronically Signed   By: Bretta Bang III M.D.   On: 05/21/2017 13:00   Dg Tibia/fibula  Right  Result Date: 05/21/2017 CLINICAL DATA:  56 year old male undergoing ORIF of right tibia fracture. EXAM: DG C-ARM 61-120 MIN; RIGHT TIBIA AND FIBULA - 2 VIEW COMPARISON:  1236 hours today. FINDINGS: Five intraoperative fluoroscopic spot views of the right tib-fib. Right tibia intramedullary rod with interlocking proximal and distal cortical screws traverses the comminuted spiral fracture of the distal shaft with substantially improved alignment. There is a cannulated screw now traversing the distal tibia fibular syndesmosis. Comminuted distal right fibula shaft fracture also demonstrates improved alignment. Mortise joint alignment appears stable. FLUOROSCOPY TIME:  2 minutes 7 seconds IMPRESSION: ORIF right tib-fib with no adverse features. Electronically Signed   By: Odessa Fleming M.D.   On: 05/21/2017 20:03   Dg Tibia/fibula Right  Result Date: 05/21/2017 CLINICAL DATA:  Pain following fall EXAM: RIGHT TIBIA AND FIBULA - 2 VIEW COMPARISON:  July 16, 2016 FINDINGS: Frontal and lateral views were obtained. There is a comminuted fracture of the tibia in the diaphyseal region approximately 8.5 cm proximal to the tibial plafond. This fracture is comminuted with lateral displacement and medial angulation of the distal major fracture fragment with respect proximal major fragment. In addition, there is a comminuted obliquely oriented fracture of the distal fibular diaphysis with mild lateral displacement and anterior angulation distally. No other fractures are evident. No dislocations are appreciable. There is a spur arising from the inferior calcaneus. IMPRESSION: Comminuted distal tibial and fibular fractures with displacement and angulation of fracture fragments. No other fractures. No dislocations. Inferior calcaneal spur noted. Electronically Signed   By: Bretta Bang III M.D.   On: 05/21/2017 12:59   Ct Ankle Right Wo Contrast  Result Date: 05/21/2017 CLINICAL DATA:  Distal tibial and fibular  fractures. EXAM: CT OF THE RIGHT ANKLE WITHOUT CONTRAST TECHNIQUE: Multidetector CT imaging of the right ankle was performed according to the standard protocol. Multiplanar CT image reconstructions were also generated. COMPARISON:  None. FINDINGS: Bones/Joint/Cartilage 1. Acute, closed, comminuted distal diaphyseal fracture of the fibula with 1 shaft width medial and posterior displacement of the distal fracture fragment. Roughly 18 degrees of valgus angulation is noted of the distal fracture fragment and reformatted images. 2. Acute, closed, comminuted distal diaphyseal fracture of the tibia with fracture approximately 7 cm from the tibial plafond. 23 degrees of valgus angulation is noted of the distal fracture fragment on reformatted images with 1 shaft width lateral displacement and 1/4 shaft width dorsal displacement of the main distal fracture fragment. 3. Intact tibiotalar, subtalar and midfoot articulations. Calcaneal enthesopathy is noted along the plantar dorsal aspect. Ligaments Suboptimally assessed by CT. Muscles and Tendons No intramuscular hemorrhage.  No muscle atrophy. Soft tissues Posttraumatic soft tissue edema adjacent to the fractures. No drainable fluid collection or active hemorrhage. IMPRESSION:  1. Acute, closed, comminuted distal diaphyseal fracture of the fibula with 1 shaft width medial and posterior displacement of the distal fracture fragment. Roughly 18 degrees of valgus angulation is noted of the distal fracture fragment. 2. Acute, closed, comminuted distal diaphyseal fracture of the tibia with fracture approximately 7 cm from the tibial plafond. 23 degrees of valgus angulation is noted of the distal fracture fragment with 1 shaft width lateral displacement and 1/4 shaft width dorsal displacement of the main distal fracture fragment. 3. Intact tibiotalar, subtalar and midfoot articulations. Calcaneal enthesopathy is noted along the plantar dorsal aspect. Electronically Signed   By:  Tollie Ethavid  Kwon M.D.   On: 05/21/2017 16:27   Dg C-arm 1-60 Min  Result Date: 05/21/2017 CLINICAL DATA:  56 year old male undergoing ORIF of right tibia fracture. EXAM: DG C-ARM 61-120 MIN; RIGHT TIBIA AND FIBULA - 2 VIEW COMPARISON:  1236 hours today. FINDINGS: Five intraoperative fluoroscopic spot views of the right tib-fib. Right tibia intramedullary rod with interlocking proximal and distal cortical screws traverses the comminuted spiral fracture of the distal shaft with substantially improved alignment. There is a cannulated screw now traversing the distal tibia fibular syndesmosis. Comminuted distal right fibula shaft fracture also demonstrates improved alignment. Mortise joint alignment appears stable. FLUOROSCOPY TIME:  2 minutes 7 seconds IMPRESSION: ORIF right tib-fib with no adverse features. Electronically Signed   By: Odessa FlemingH  Hall M.D.   On: 05/21/2017 20:03    Disposition: 01-Home or Self Care    Follow-up Information    Sheral ApleyMurphy, Timothy D, MD Follow up in 10 day(s).   Specialty:  Orthopedic Surgery Contact information: [address] [place] [id number] [phone]            Signed: Albina BilletHenry Calvin Martensen III PA-C 05/22/2017, 5:47 AM

## 2017-05-22 NOTE — Progress Notes (Signed)
by Dr. Jewel Baizeimothy D. Murphy on 05/21/17  Principal Problem:   Right tibial fracture Active Problems:   Closed right tibial fracture   Right tibia fracture status post IM nail Pain controlled, eating, drinking, voiding. Therapy evaluations pending.   Up with therapy D/C IV fluids Incentive Spirometry Elevate and apply ice  Weight Bearing: Weight Bearing as Tolerated (WBAT) RLE Dressings: Maintain surgical dressings until follow-up.  VTE prophylaxis: Lovenox while inpatient, SCDs, ambulation.  ASA 81 mg after discharge Dispo: Home today after therapy evaluations  Subjective: Patient reports pain as moderate.  Tolerating diet.  Urinating.  No CP, SOB.   Objective:   VITALS:   Vitals:   05/21/17 1941 05/21/17 1956 05/21/17 2015 05/22/17 0204  BP: (!) 167/77  (!) 151/68 124/63  Pulse: 92  98 (!) 103  Resp: (!) 8   15  Temp:  97.9 F (36.6 C) 98.2 F (36.8 C) 98.4 F (36.9 C)  TempSrc:   Oral Oral  SpO2: 99%  95% 100%  Weight:      Height:       CBC Latest Ref Rng & Units 05/21/2017 05/21/2017  WBC 4.0 - 10.5 K/uL - 10.7(H)  Hemoglobin 13.0 - 17.0 g/dL 45.416.0 09.815.1  Hematocrit 11.939.0 - 52.0 % 47.0 44.8  Platelets 150 - 400 K/uL - 300   BMP Latest Ref Rng & Units 05/21/2017  Glucose 65 - 99 mg/dL 147(W117(H)  BUN 6 - 20 mg/dL 10  Creatinine 2.950.61 - 6.211.24 mg/dL 3.081.10  Sodium 657135 - 846145 mmol/L 139  Potassium 3.5 - 5.1 mmol/L 4.4  Chloride 101 - 111 mmol/L 104   Intake/Output      12/11 0701 - 12/12 0700   P.O. 360   I.V. (mL/kg) 1662.5 (14.4)   IV Piggyback 100   Total Intake(mL/kg) 2122.5 (18.3)   Urine (mL/kg/hr) 600   Blood 150   Total Output 750   Net +1372.5         Physical Exam: General: NAD.  Supine in bed.  Calm, conversant. Resp: No increased wob Cardio: regular rate and rhythm ABD soft Neurologically intact MSK Neurovascularly intact Sensation intact distally Feet warm EHL FHL plantar flexion dorsiflexion intact Incision: dressing C/D/I   Lee Gregory  Lee Martensen III, PA-C 05/22/2017, 5:42 AM

## 2017-05-23 NOTE — Progress Notes (Signed)
Patient  discharge at this time. Patient was picked up by family and and took his equipment with him.  Patient is off the unit via wheelchair.

## 2017-05-27 NOTE — Anesthesia Postprocedure Evaluation (Signed)
Anesthesia Post Note  Patient: Lee Gregory  Procedure(s) Performed: INTRAMEDULLARY (IM) NAIL TIBIAL (Right Ankle)     Patient location during evaluation: PACU Anesthesia Type: General Level of consciousness: awake Pain management: pain level controlled Vital Signs Assessment: post-procedure vital signs reviewed and stable Respiratory status: spontaneous breathing Cardiovascular status: stable Postop Assessment: no apparent nausea or vomiting Anesthetic complications: no    Last Vitals:  Vitals:   05/22/17 0700 05/22/17 1344  BP: (!) 140/55 (!) 120/55  Pulse: 97 96  Resp: 15 18  Temp: 36.5 C 36.9 C  SpO2: 100% 95%    Last Pain:  Vitals:   05/22/17 1344  TempSrc: Oral  PainSc:    Pain Goal:                 Lee Gregory,Lee Gregory

## 2019-05-17 IMAGING — DX DG TIBIA/FIBULA 2V*R*
4 series · 4 of 4 positions shown · non-contrast
Comparison: July 16, 2016

CLINICAL DATA: Pain following fall

EXAM:
RIGHT TIBIA AND FIBULA - 2 VIEW

[tibia ap (1 of 2)]
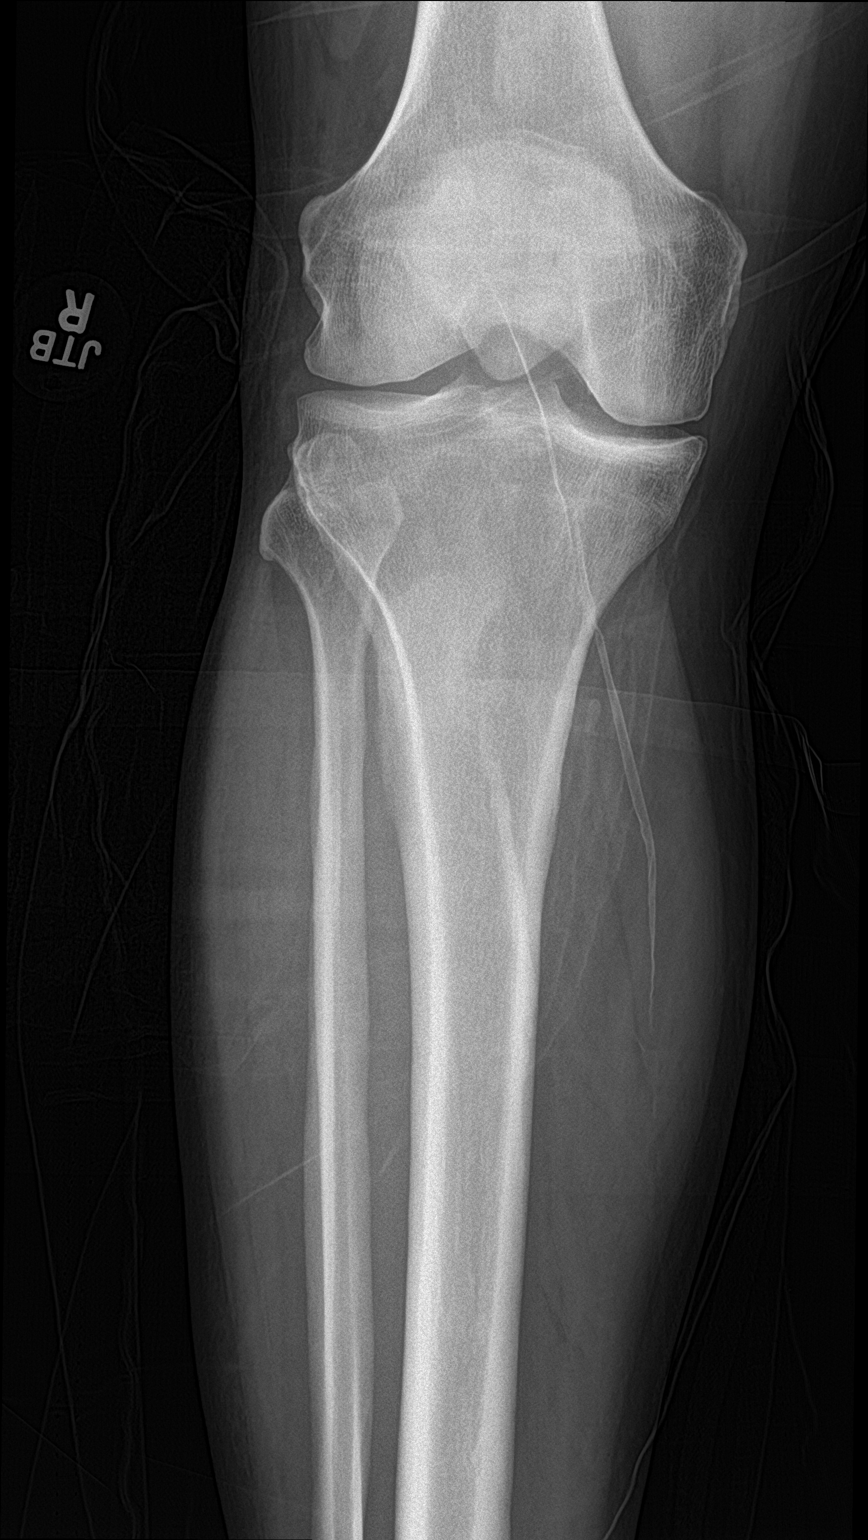

[tibia ap (2 of 2)]
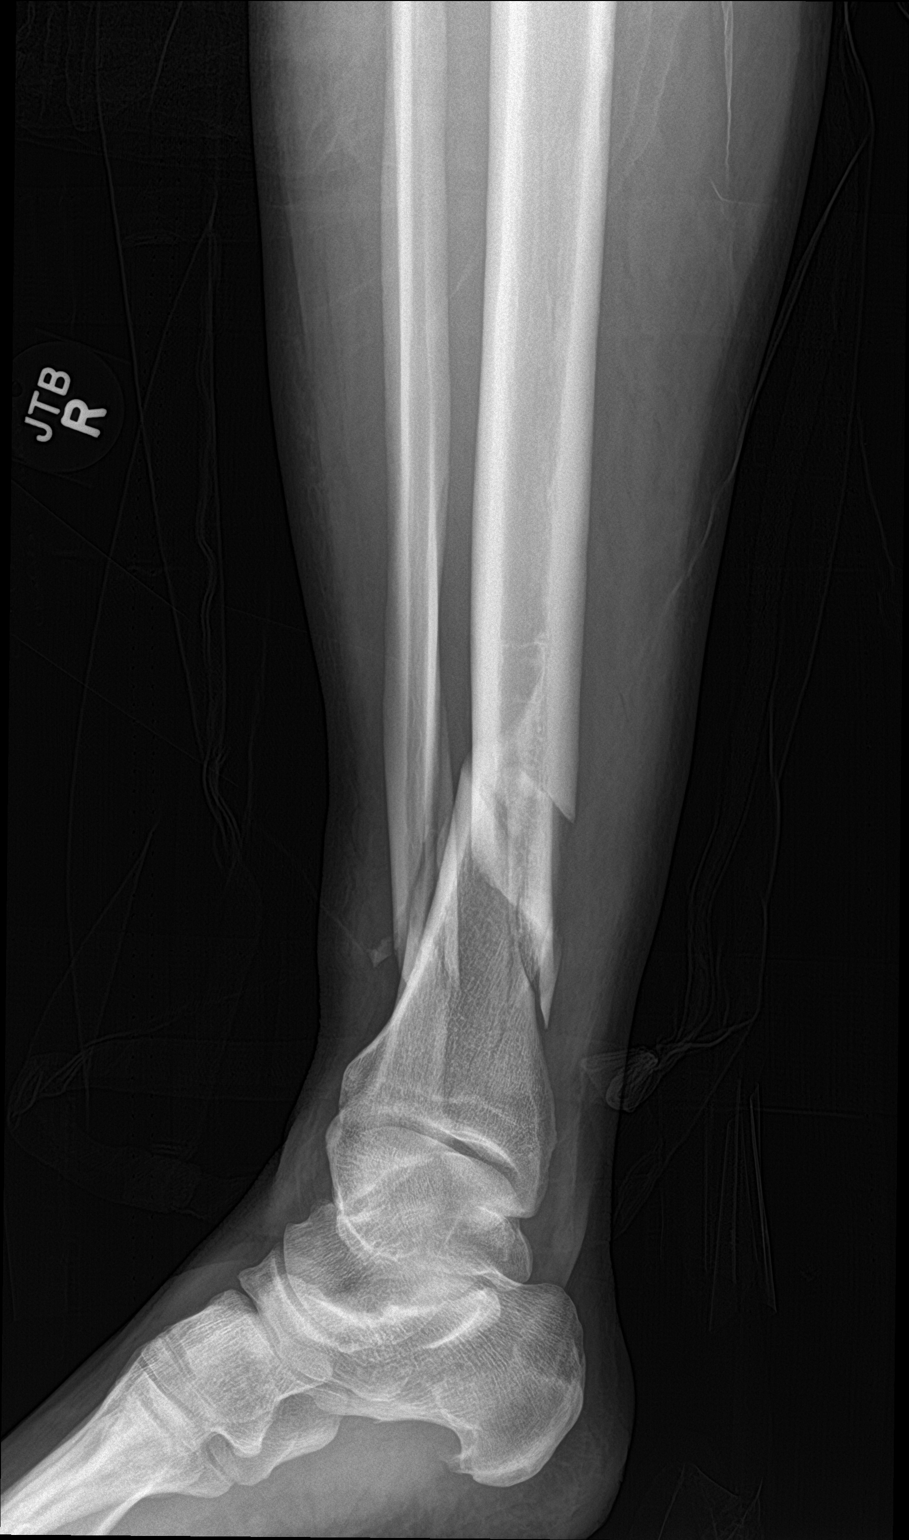

[tibia lat (1 of 2)]
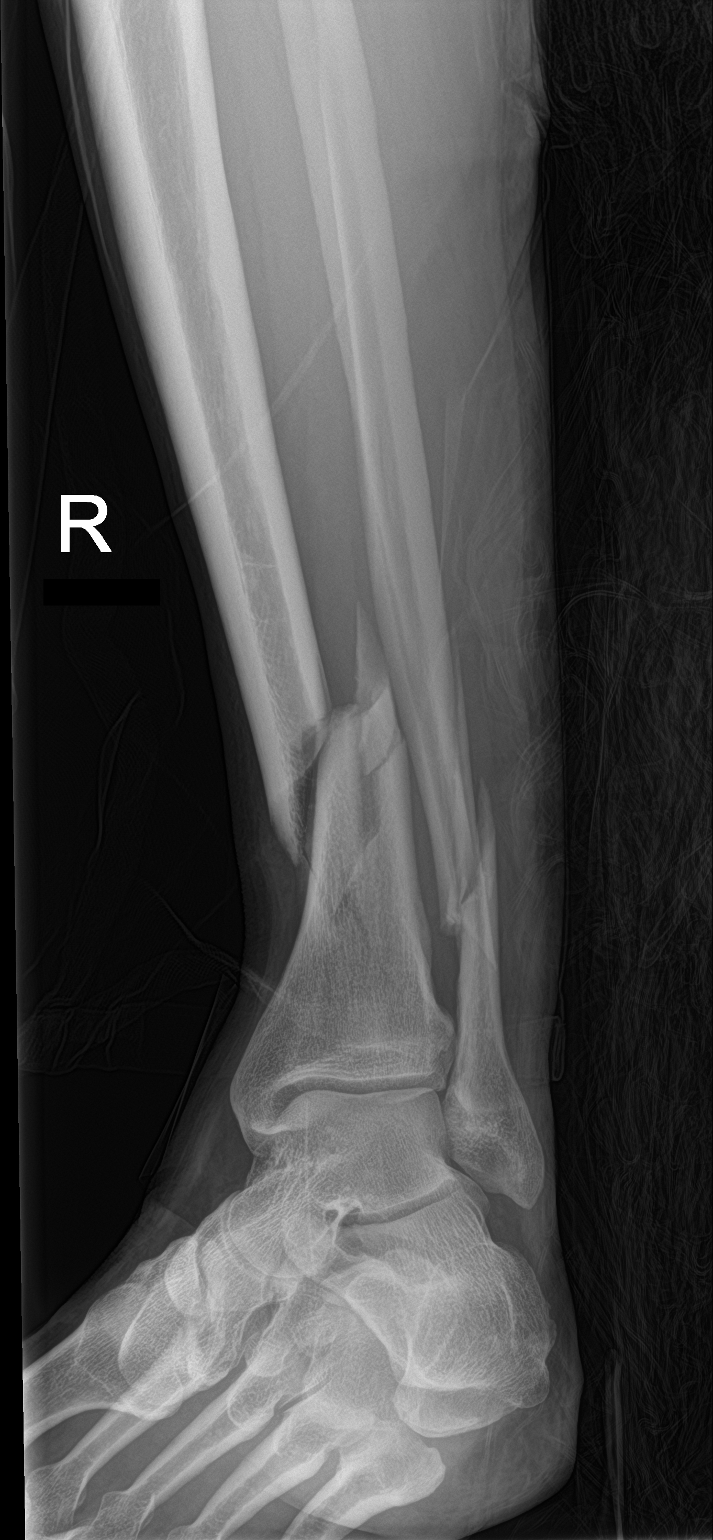

[tibia lat (2 of 2)]
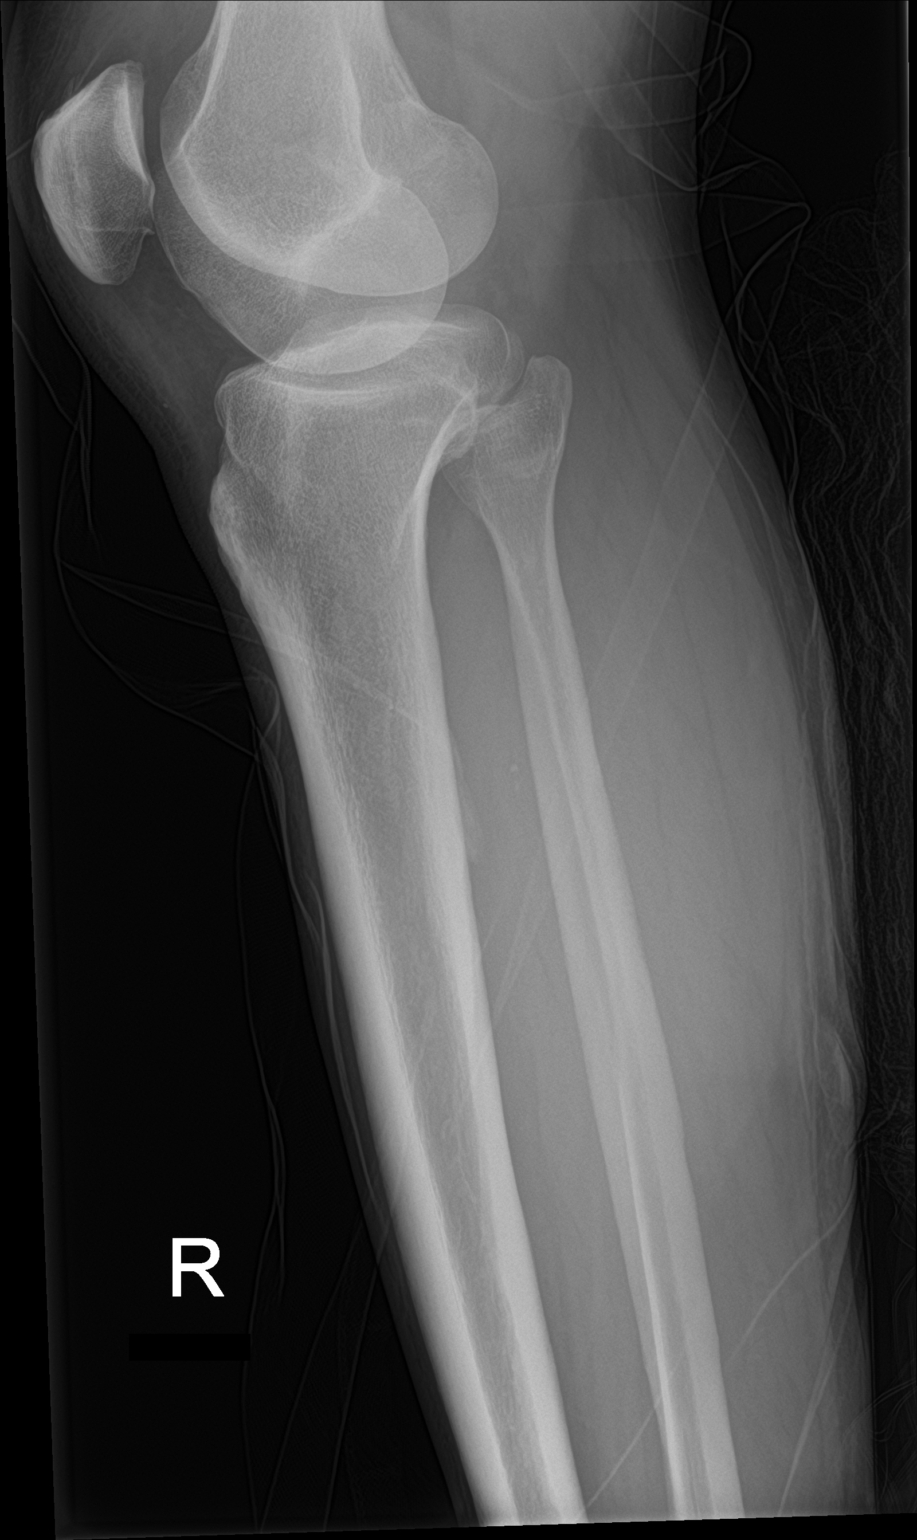

[4 of 4 positions shown; findings below may reference images not displayed]

FINDINGS: Frontal and lateral views were obtained. There is a comminuted
fracture of the tibia in the diaphyseal region approximately 8.5 cm
proximal to the tibial plafond. This fracture is comminuted with
lateral displacement and medial angulation of the distal major
fracture fragment with respect proximal major fragment. In addition,
there is a comminuted obliquely oriented fracture of the distal
fibular diaphysis with mild lateral displacement and anterior
angulation distally. No other fractures are evident. No dislocations
are appreciable. There is a spur arising from the inferior
calcaneus.
IMPRESSION: Comminuted distal tibial and fibular fractures with displacement and
angulation of fracture fragments. No other fractures. No
dislocations. Inferior calcaneal spur noted.
